# Patient Record
Sex: Female | Born: 1990 | Hispanic: Yes | Marital: Single | Smoking: Never smoker
Health system: Southern US, Community
[De-identification: ages and names within clinical notes are randomized; demographics above are authoritative.]

## PROBLEM LIST (undated history)

## (undated) ENCOUNTER — Inpatient Hospital Stay (HOSPITAL_COMMUNITY): Payer: Medicaid Other

---
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
## 2016-05-03 ENCOUNTER — Emergency Department (HOSPITAL_COMMUNITY)
Admission: EM | Admit: 2016-05-03 | Discharge: 2016-05-03 | Disposition: A | Discharge status: Home / Self Care | Payer: Self-pay | Attending: Emergency Medicine | Admitting: Emergency Medicine

## 2016-05-03 ENCOUNTER — Encounter (HOSPITAL_COMMUNITY): Payer: Self-pay

## 2016-05-03 DIAGNOSIS — J45909 Unspecified asthma, uncomplicated: Secondary | ICD-10-CM | POA: Insufficient documentation

## 2016-05-03 DIAGNOSIS — R102 Pelvic and perineal pain: Secondary | ICD-10-CM | POA: Insufficient documentation

## 2016-05-03 LAB — CBC
HEMATOCRIT: 39.5 % (ref 36.0–46.0)
HEMOGLOBIN: 13.8 g/dL (ref 12.0–15.0)
MCH: 30.9 pg (ref 26.0–34.0)
MCHC: 34.9 g/dL (ref 30.0–36.0)
MCV: 88.6 fL (ref 78.0–100.0)
Platelets: 207 10*3/uL (ref 150–400)
RBC: 4.46 MIL/uL (ref 3.87–5.11)
RDW: 12.1 % (ref 11.5–15.5)
WBC: 6.9 10*3/uL (ref 4.0–10.5)

## 2016-05-03 LAB — WET PREP, GENITAL
Clue Cells Wet Prep HPF POC: NONE SEEN
Sperm: NONE SEEN
Trich, Wet Prep: NONE SEEN
YEAST WET PREP: NONE SEEN

## 2016-05-03 LAB — LIPASE, BLOOD: LIPASE: 24 U/L (ref 11–51)

## 2016-05-03 LAB — URINALYSIS, ROUTINE W REFLEX MICROSCOPIC
Bilirubin Urine: NEGATIVE
GLUCOSE, UA: NEGATIVE mg/dL
Hgb urine dipstick: NEGATIVE
KETONES UR: NEGATIVE mg/dL
Nitrite: NEGATIVE
PH: 5.5 (ref 5.0–8.0)
Protein, ur: NEGATIVE mg/dL
SPECIFIC GRAVITY, URINE: 1.017 (ref 1.005–1.030)

## 2016-05-03 LAB — COMPREHENSIVE METABOLIC PANEL
ALBUMIN: 4.2 g/dL (ref 3.5–5.0)
ALT: 12 U/L — ABNORMAL LOW (ref 14–54)
ANION GAP: 7 (ref 5–15)
AST: 19 U/L (ref 15–41)
Alkaline Phosphatase: 88 U/L (ref 38–126)
BUN: 8 mg/dL (ref 6–20)
CHLORIDE: 106 mmol/L (ref 101–111)
CO2: 24 mmol/L (ref 22–32)
Calcium: 9.8 mg/dL (ref 8.9–10.3)
Creatinine, Ser: 0.65 mg/dL (ref 0.44–1.00)
GFR calc Af Amer: >60 mL/min (ref >60)
GLUCOSE: 94 mg/dL (ref 65–99)
POTASSIUM: 4 mmol/L (ref 3.5–5.1)
Sodium: 137 mmol/L (ref 135–145)
Total Bilirubin: 0.5 mg/dL (ref 0.3–1.2)
Total Protein: 6.7 g/dL (ref 6.5–8.1)

## 2016-05-03 LAB — URINE MICROSCOPIC-ADD ON

## 2016-05-03 LAB — PREGNANCY, URINE: Preg Test, Ur: NEGATIVE

## 2016-05-03 LAB — POC URINE PREG, ED: PREG TEST UR: NEGATIVE

## 2016-05-03 MED ORDER — NAPROXEN 250 MG PO TABS
500.0000 mg | ORAL_TABLET | Freq: Once | ORAL | Status: AC
  Administered 2016-05-03: 500 mg via ORAL
  Filled 2016-05-03: qty 2

## 2016-05-03 MED ORDER — NAPROXEN 500 MG PO TABS: 500.0000 mg | ORAL_TABLET | Freq: Two times a day (BID) | ORAL | 0 refills | Status: DC | PRN

## 2016-05-03 NOTE — ED Notes (Signed)
Pt ambulating independently w/ steady gait on d/c in no acute distress, A&Ox4. Rx given x1 D/c instructions reviewed w/ pt and family - pt and family deny any further questions or concerns at present.   

## 2016-05-03 NOTE — ED Provider Notes (Signed)
[facility]-EMERGENCY DEPT Provider Note   CSN: [id number] Arrival date & time: 05/03/16  1738     History   Chief Complaint Chief Complaint  Patient presents with  . Abdominal Pain    HPI Amy Villegas is a 25 y.o. female.  Patient presents with 3-4 days of lower abdominal pain. It began after she had her menstrual period several days ago and then did not improve. She has never had a similar pain. Describes it as sharp and mostly suprapubic, not radiating. She has felt nauseated but without vomiting. Denies fevers, diarrhea, or constipation. Denies vaginal discharge. History of a C section two years ago but no other abdominal surgeries. States the pain sometimes worsens when she leans forward but notes no other exacerbating or alleviating symptoms.   The history is provided by the patient. No language interpreter was used.  Abdominal Pain   This is a new problem. The current episode started more than 2 days ago. The problem occurs constantly. The problem has not changed since onset.Associated with: Menstrual cycle. The pain is located in the suprapubic region. The quality of the pain is sharp. The pain is at a severity of 5/10. The pain is moderate. Associated symptoms include nausea. Pertinent negatives include anorexia, fever, diarrhea, vomiting, constipation, dysuria and hematuria. The symptoms are aggravated by certain positions. Nothing relieves the symptoms. Past workup does not include GI consult, CT scan or surgery.    Past Medical History:  Diagnosis Date  . Asthma     There are no active problems to display for this patient.   History reviewed. No pertinent surgical history.  OB History    No data available       Home Medications    Prior to Admission medications   Medication Sig Start Date End Date Taking? Authorizing Provider  naproxen (NAPROSYN) 500 MG tablet Take 1 tablet (500 mg total) by mouth 2 (two) times daily as needed for mild pain or moderate  pain. 05/03/16   Preston FleetingAnna Ayanna Gheen, MD    Family History History reviewed. No pertinent family history.  Social History Social History  Substance Use Topics  . Smoking status: Never Smoker  . Smokeless tobacco: Never Used  . Alcohol use No     Allergies   Patient has no known allergies.   Review of Systems Review of Systems  Constitutional: Negative for fever.  HENT: Negative.   Respiratory: Negative.   Cardiovascular: Negative.   Gastrointestinal: Positive for abdominal pain and nausea. Negative for anorexia, constipation, diarrhea and vomiting.  Genitourinary: Negative for dysuria, hematuria, vaginal bleeding and vaginal discharge.  Musculoskeletal: Negative.   Skin: Negative.   Allergic/Immunologic: Negative for immunocompromised state.  Neurological: Negative.   Hematological: Does not bruise/bleed easily.  Psychiatric/Behavioral: Negative.      Physical Exam Updated Vital Signs BP 112/65   Pulse 71   Temp 98.4 F (36.9 C) (Oral)   Resp 16   LMP 04/30/2016 (Within Days)   SpO2 99%   Physical Exam  Constitutional: She is oriented to person, place, and time. She appears well-developed and well-nourished. No distress.  HENT:  Head: Normocephalic and atraumatic.  Eyes: Conjunctivae and EOM are normal.  Neck: Normal range of motion. Neck supple.  Cardiovascular: Normal rate and regular rhythm.   Pulmonary/Chest: Effort normal. No respiratory distress.  Abdominal: Soft. Normal appearance. There is tenderness in the suprapubic area. There is no rigidity, no rebound, no guarding, no CVA tenderness, no tenderness at McBurney's point and negative Murphy's  sign.  Neurological: She is alert and oriented to person, place, and time.  Skin: She is not diaphoretic.  Psychiatric: She has a normal mood and affect. Her behavior is normal. Judgment and thought content normal.     ED Treatments / Results  Labs (all labs ordered are listed, but only abnormal results are  displayed) Labs Reviewed  WET PREP, GENITAL - Abnormal; Notable for the following:       Result Value   WBC, Wet Prep HPF POC MANY (*)    All other components within normal limits  COMPREHENSIVE METABOLIC PANEL - Abnormal; Notable for the following:    ALT 12 (*)    All other components within normal limits  URINALYSIS, ROUTINE W REFLEX MICROSCOPIC (NOT AT [facility]) - Abnormal; Notable for the following:    Leukocytes, UA TRACE (*)    All other components within normal limits  URINE MICROSCOPIC-ADD ON - Abnormal; Notable for the following:    Squamous Epithelial / LPF 6-30 (*)    Bacteria, UA RARE (*)    All other components within normal limits  LIPASE, BLOOD  CBC  PREGNANCY, URINE  POC URINE PREG, ED  GC/CHLAMYDIA PROBE AMP ([place]) NOT AT [facility]    EKG  EKG Interpretation None       Radiology No results found.  Procedures Procedures (including critical care time)  Medications Ordered in ED Medications  naproxen (NAPROSYN) tablet 500 mg (500 mg Oral Given 05/03/16 2243)     Initial Impression / Assessment and Plan / ED Course  I have reviewed the triage vital signs and the nursing notes.  Pertinent labs & imaging results that were available during my care of the patient were reviewed by me and considered in my medical decision making (see chart for details).  Clinical Course     Patient presents with several days of sharp lower abdominal pain associated with her menstrual cycle but reports it has not improved even though she is no longer menstruating. She is overall well-appearing and afebrile and appears comfortable. Vital signs are unremarkable. She is not pregnant. No signs of UTI/pyelonephritis and labs were unremarkable. Pelvic unremarkable without signs of PID or cervicitis. She has no RLQ tenderness and I do not suspect appendicitis. Onset of symptoms and mild nature are not suggestive of ovarian torsion. Feel that these symptoms are appropriate for  outpatient management. She was advised to follow up with her gynecologist and given return precautions for worsening symptoms. She expressed understanding and is in good condition for discharge home.  Final Clinical Impressions(s) / ED Diagnoses   Final diagnoses:  Pelvic pain in female    New Prescriptions New Prescriptions   NAPROXEN (NAPROSYN) 500 MG TABLET    Take 1 tablet (500 mg total) by mouth 2 (two) times daily as needed for mild pain or moderate pain.     Preston FleetingAnna Alieah Brinton, MD 05/03/16 [id number]    Gerhard Munchobert Lockwood, MD 05/07/16 [id number]

## 2016-05-03 NOTE — Discharge Instructions (Signed)
Please go see your gynecologist within the next week if your symptoms continue. Come back sooner if you are having high fever, vomiting, severe pain, or any other concerns.

## 2016-05-03 NOTE — ED Triage Notes (Signed)
Pt reports lower abd pain X3 days. Pt reports LMP 04/30/2016. Pt reports some nausea as well. Pt denies urinary sx.

## 2016-05-05 LAB — GC/CHLAMYDIA PROBE AMP (~~LOC~~) NOT AT ARMC
Chlamydia: NEGATIVE
Neisseria Gonorrhea: NEGATIVE

## 2017-11-14 ENCOUNTER — Encounter (HOSPITAL_COMMUNITY): Payer: Self-pay | Admitting: Emergency Medicine

## 2017-11-14 ENCOUNTER — Emergency Department (HOSPITAL_COMMUNITY)
Admission: EM | Admit: 2017-11-14 | Discharge: 2017-11-15 | Disposition: A | Discharge status: Home / Self Care | Payer: Medicaid Other | Attending: Emergency Medicine | Admitting: Emergency Medicine

## 2017-11-14 DIAGNOSIS — O26899 Other specified pregnancy related conditions, unspecified trimester: Secondary | ICD-10-CM | POA: Diagnosis not present

## 2017-11-14 DIAGNOSIS — R1084 Generalized abdominal pain: Secondary | ICD-10-CM | POA: Diagnosis not present

## 2017-11-14 DIAGNOSIS — Z3A00 Weeks of gestation of pregnancy not specified: Secondary | ICD-10-CM | POA: Insufficient documentation

## 2017-11-14 DIAGNOSIS — O26859 Spotting complicating pregnancy, unspecified trimester: Secondary | ICD-10-CM | POA: Insufficient documentation

## 2017-11-14 DIAGNOSIS — Z5321 Procedure and treatment not carried out due to patient leaving prior to being seen by health care provider: Secondary | ICD-10-CM | POA: Diagnosis not present
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
## 2017-11-14 NOTE — ED Triage Notes (Signed)
Attempted doppler FHR - unable to find at this time.

## 2017-11-15 LAB — HCG, QUANTITATIVE, PREGNANCY: HCG, BETA CHAIN, QUANT, S: 98614 m[IU]/mL — AB (ref <5)

## 2017-11-15 NOTE — ED Notes (Signed)
1300  11/15/17  Follow up call made  No answer  s Tavien Chestnut rn

## 2020-03-20 DIAGNOSIS — U071 COVID-19: Secondary | ICD-10-CM

## 2020-03-20 HISTORY — DX: COVID-19: U07.1

## 2020-04-16 ENCOUNTER — Ambulatory Visit
Admission: EM | Admit: 2020-04-16 | Discharge: 2020-04-16 | Disposition: A | Discharge status: Home / Self Care | Payer: Medicaid Other | Attending: Emergency Medicine | Admitting: Emergency Medicine

## 2020-04-16 ENCOUNTER — Other Ambulatory Visit: Payer: Self-pay

## 2020-04-16 ENCOUNTER — Emergency Department (HOSPITAL_COMMUNITY)
Admission: EM | Admit: 2020-04-16 | Discharge: 2020-04-16 | Disposition: A | Discharge status: Home / Self Care | Payer: Medicaid Other | Attending: Emergency Medicine | Admitting: Emergency Medicine

## 2020-04-16 ENCOUNTER — Emergency Department (HOSPITAL_COMMUNITY): Payer: Medicaid Other

## 2020-04-16 ENCOUNTER — Encounter (HOSPITAL_COMMUNITY): Payer: Self-pay | Admitting: Emergency Medicine

## 2020-04-16 DIAGNOSIS — J45909 Unspecified asthma, uncomplicated: Secondary | ICD-10-CM | POA: Insufficient documentation

## 2020-04-16 DIAGNOSIS — R1031 Right lower quadrant pain: Secondary | ICD-10-CM | POA: Insufficient documentation

## 2020-04-16 DIAGNOSIS — R110 Nausea: Secondary | ICD-10-CM | POA: Diagnosis not present

## 2020-04-16 LAB — URINALYSIS, ROUTINE W REFLEX MICROSCOPIC
Bilirubin Urine: NEGATIVE
Glucose, UA: NEGATIVE mg/dL
Hgb urine dipstick: NEGATIVE
Ketones, ur: 20 mg/dL — AB
Leukocytes,Ua: NEGATIVE
Nitrite: NEGATIVE
Protein, ur: NEGATIVE mg/dL
Specific Gravity, Urine: 1.018 (ref 1.005–1.030)
pH: 5 (ref 5.0–8.0)

## 2020-04-16 LAB — CBC
HCT: 44.4 % (ref 36.0–46.0)
Hemoglobin: 14.6 g/dL (ref 12.0–15.0)
MCH: 29.7 pg (ref 26.0–34.0)
MCHC: 32.9 g/dL (ref 30.0–36.0)
MCV: 90.2 fL (ref 80.0–100.0)
Platelets: 232 10*3/uL (ref 150–400)
RBC: 4.92 MIL/uL (ref 3.87–5.11)
RDW: 12.5 % (ref 11.5–15.5)
WBC: 8.8 10*3/uL (ref 4.0–10.5)
nRBC: 0 % (ref 0.0–0.2)

## 2020-04-16 LAB — COMPREHENSIVE METABOLIC PANEL
ALT: 15 U/L (ref 0–44)
AST: 18 U/L (ref 15–41)
Albumin: 4.4 g/dL (ref 3.5–5.0)
Alkaline Phosphatase: 94 U/L (ref 38–126)
Anion gap: 12 (ref 5–15)
BUN: 8 mg/dL (ref 6–20)
CO2: 22 mmol/L (ref 22–32)
Calcium: 9.5 mg/dL (ref 8.9–10.3)
Chloride: 103 mmol/L (ref 98–111)
Creatinine, Ser: 0.62 mg/dL (ref 0.44–1.00)
GFR, Estimated: >60 mL/min (ref >60)
Glucose, Bld: 87 mg/dL (ref 70–99)
Potassium: 3.8 mmol/L (ref 3.5–5.1)
Sodium: 137 mmol/L (ref 135–145)
Total Bilirubin: 0.6 mg/dL (ref 0.3–1.2)
Total Protein: 7.7 g/dL (ref 6.5–8.1)

## 2020-04-16 LAB — POCT URINALYSIS DIP (MANUAL ENTRY)
Bilirubin, UA: NEGATIVE
Blood, UA: NEGATIVE
Glucose, UA: NEGATIVE mg/dL
Ketones, POC UA: NEGATIVE mg/dL
Leukocytes, UA: NEGATIVE
Nitrite, UA: NEGATIVE
Protein Ur, POC: NEGATIVE mg/dL
Spec Grav, UA: 1.025 (ref 1.010–1.025)
Urobilinogen, UA: 0.2 E.U./dL
pH, UA: 5.5 (ref 5.0–8.0)

## 2020-04-16 LAB — LIPASE, BLOOD: Lipase: 22 U/L (ref 11–51)

## 2020-04-16 LAB — POCT URINE PREGNANCY: Preg Test, Ur: NEGATIVE

## 2020-04-16 LAB — WET PREP, GENITAL
Clue Cells Wet Prep HPF POC: NONE SEEN
Sperm: NONE SEEN
Trich, Wet Prep: NONE SEEN
Yeast Wet Prep HPF POC: NONE SEEN

## 2020-04-16 MED ORDER — DICYCLOMINE HCL 20 MG PO TABS: 20.0000 mg | ORAL_TABLET | Freq: Three times a day (TID) | ORAL | 0 refills | Status: DC | PRN

## 2020-04-16 MED ORDER — ONDANSETRON 4 MG PO TBDP: 4.0000 mg | ORAL_TABLET | Freq: Three times a day (TID) | ORAL | 0 refills | Status: AC | PRN

## 2020-04-16 MED ORDER — IOHEXOL 300 MG/ML  SOLN
100.0000 mL | Freq: Once | INTRAMUSCULAR | Status: AC | PRN
  Administered 2020-04-16: 100 mL via INTRAVENOUS

## 2020-04-16 MED ORDER — MORPHINE SULFATE (PF) 4 MG/ML IV SOLN
4.0000 mg | Freq: Once | INTRAVENOUS | Status: AC
  Administered 2020-04-16: 4 mg via INTRAVENOUS
  Filled 2020-04-16: qty 1

## 2020-04-16 MED ORDER — LACTATED RINGERS IV BOLUS
1000.0000 mL | Freq: Once | INTRAVENOUS | Status: AC
  Administered 2020-04-16: 1000 mL via INTRAVENOUS

## 2020-04-16 NOTE — ED Notes (Signed)
Pt given ginger ale, graham crackers and peanut butter. 

## 2020-04-16 NOTE — ED Provider Notes (Signed)
[facility] EMERGENCY DEPARTMENT Provider Note   CSN: [id number] Arrival date & time: 04/16/20  1527     History Chief Complaint  Patient presents with   Abdominal Pain    Amy Villegas is a 29 y.o. female with no pertinent past medical history who presents today for evaluation of 2 days of nausea and right lower quadrant abdominal pain.  She was originally seen at urgent care today where system review shows she had a negative pregnancy test, based on the right lower quadrant location of her pain she was referred here for further evaluation.  She reports she still has her appendix, only prior abdominal surgery is a C-section.  She reports the pain is sharp, at the worst it is 8 out of 10.  She has a history of kidney stones however says this is not exactly the same.  Her pain gets worse with eating.  She has tried Tylenol without significant during her symptoms.  She denies any changes in her bowel movements, no constipation or diarrhea.  She is nauseous without vomiting.  She denies any dysuria, increased frequency or urgency.  No abnormal vaginal discharge or bleeding.  She is not concerned about STDs stating she has 1 partner and is monogamous.  HPI     Past Medical History:  Diagnosis Date   Asthma     There are no problems to display for this patient.   History reviewed. No pertinent surgical history.   OB History    Gravida  1   Para      Term      Preterm      AB      Living        SAB      TAB      Ectopic      Multiple      Live Births              History reviewed. No pertinent family history.  Social History   Tobacco Use   Smoking status: Never Smoker   Smokeless tobacco: Never Used  Substance Use Topics   Alcohol use: No   Drug use: No    Home Medications Prior to Admission medications   Medication Sig Start Date End Date Taking? Authorizing Provider  dicyclomine (BENTYL) 20 MG tablet Take 1 tablet (20 mg total) by  mouth 3 (three) times daily as needed for spasms (abdominal pain). 04/16/20   Cristina Gong, PA-C  naproxen (NAPROSYN) 500 MG tablet Take 1 tablet (500 mg total) by mouth 2 (two) times daily as needed for mild pain or moderate pain. 05/03/16   Preston Fleeting, MD  ondansetron (ZOFRAN ODT) 4 MG disintegrating tablet Take 1 tablet (4 mg total) by mouth every 8 (eight) hours as needed for nausea or vomiting. 04/16/20   Cristina Gong, PA-C    Allergies    Patient has no known allergies.  Review of Systems   Review of Systems  Constitutional: Negative for chills and fever.  Respiratory: Negative for shortness of breath.   Cardiovascular: Negative for chest pain.  Gastrointestinal: Positive for abdominal pain and nausea. Negative for abdominal distention, constipation and vomiting.  Genitourinary: Negative for dysuria, flank pain, frequency and urgency.  Musculoskeletal: Negative for back pain and neck pain.  Skin: Negative for color change and rash.  Neurological: Negative for weakness and headaches.  All other systems reviewed and are negative.   Physical Exam Updated Vital Signs BP 114/65  Pulse 76    Temp 98.3 F (36.8 C) (Oral)    Resp 18    Ht 5\' 2"  (1.575 m)    Wt 72.6 kg    LMP  (LMP Unknown)    SpO2 100%    BMI 29.26 kg/m   Physical Exam Vitals and nursing note reviewed. Exam conducted with a chaperone present.  Constitutional:      General: She is not in acute distress.    Appearance: She is well-developed.  HENT:     Head: Normocephalic and atraumatic.  Eyes:     Conjunctiva/sclera: Conjunctivae normal.  Cardiovascular:     Rate and Rhythm: Normal rate and regular rhythm.     Heart sounds: No murmur heard.   Pulmonary:     Effort: Pulmonary effort is normal. No respiratory distress.     Breath sounds: Normal breath sounds.  Abdominal:     General: Abdomen is flat. Bowel sounds are normal.     Palpations: Abdomen is soft.     Tenderness: There is  abdominal tenderness in the right lower quadrant. There is no right CVA tenderness, left CVA tenderness, guarding or rebound.  Genitourinary:    Adnexa: Right adnexa normal and left adnexa normal.     Comments: Normal external female genitalia.  There is a moderate amount of white discharge in the vaginal canal.  Mild tenderness to palpation in the right adnexa, no fullness appreciated.  No cervical motion tenderness or purulent discharge from the cervical os.   Musculoskeletal:     Cervical back: Neck supple.  Skin:    General: Skin is warm and dry.  Neurological:     Mental Status: She is alert.     ED Results / Procedures / Treatments   Labs (all labs ordered are listed, but only abnormal results are displayed) Labs Reviewed  WET PREP, GENITAL - Abnormal; Notable for the following components:      Result Value   WBC, Wet Prep HPF POC MODERATE (*)    All other components within normal limits  URINALYSIS, ROUTINE W REFLEX MICROSCOPIC - Abnormal; Notable for the following components:   APPearance HAZY (*)    Ketones, ur 20 (*)    All other components within normal limits  LIPASE, BLOOD  COMPREHENSIVE METABOLIC PANEL  CBC  GC/CHLAMYDIA PROBE AMP ([place]) NOT AT [facility]    EKG None  Radiology CT Abdomen Pelvis W Contrast  Result Date: 04/16/2020 CLINICAL DATA:  Right lower quadrant pain, appendicitis suspected, pain for 2 days EXAM: CT ABDOMEN AND PELVIS WITH CONTRAST TECHNIQUE: Multidetector CT imaging of the abdomen and pelvis was performed using the standard protocol following bolus administration of intravenous contrast. CONTRAST:  80 cc OMNIPAQUE IOHEXOL 300 MG/ML  SOLN COMPARISON:  None. FINDINGS: Lower chest: Lung bases are clear. Normal heart size. No pericardial effusion. Hepatobiliary: No worrisome focal liver lesions. Smooth liver surface contour. Normal hepatic attenuation. Normal gallbladder and biliary tree. Pancreas: No pancreatic ductal dilatation or surrounding  inflammatory changes. Spleen: Normal in size. No concerning splenic lesions. Adrenals/Urinary Tract: Normal adrenal glands. Kidneys are normally located with symmetric enhancement. No suspicious renal lesion, urolithiasis or hydronephrosis. Urinary bladder is unremarkable. Stomach/Bowel: Distal esophagus, stomach and duodenal sweep are unremarkable. No small bowel wall thickening or dilatation. No evidence of obstruction. Normal air-filled appendix seen coursing from the tip of the cecum to the pelvis/right adnexa. No focal periappendiceal stranding or dilatation. No extraluminal gas or organized collection. No colonic dilatation or wall thickening.  Vascular/Lymphatic: No significant vascular findings are present. No enlarged abdominal or pelvic lymph nodes. Reproductive: Anteverted uterus.  No concerning adnexal lesions. Other: No abdominopelvic free air or free fluid. No bowel containing hernia. Small fat containing umbilical hernia. Musculoskeletal: No acute osseous abnormality or suspicious osseous lesion. Musculature is normal and symmetric. IMPRESSION: 1. No evidence of acute appendicitis. Normal appendix in the right lower quadrant coursing towards the pelvic sidewall/right adnexa. 2. No other acute abnormality in the abdomen or pelvis to provide a cause for patient's symptoms. Electronically Signed   By: Kreg Shropshire M.D.   On: 04/16/2020 20:58    Procedures Procedures (including critical care time)  Medications Ordered in ED Medications  lactated ringers bolus 1,000 mL (0 mLs Intravenous Stopped 04/16/20 2042)  morphine 4 MG/ML injection 4 mg (4 mg Intravenous Given 04/16/20 2001)  iohexol (OMNIPAQUE) 300 MG/ML solution 100 mL (100 mLs Intravenous Contrast Given 04/16/20 2042)    ED Course  I have reviewed the triage vital signs and the nursing notes.  Pertinent labs & imaging results that were available during my care of the patient were reviewed by me and considered in my medical decision  making (see chart for details).    MDM Rules/Calculators/A&P                         Patient is a 29 year old woman who presents today for evaluation of 2 days of right lower quadrant abdominal pain.  She still has her appendix.  On exam she is tender to palpation in the right lower quadrant.  She denies concern for STI.  Pelvic exam is performed showing mild right adnexal pain.  CT abdomen pelvis with contrast is obtained showing normal appendix with no other acute abnormality in the abdomen or pelvis to cause her pain.  Given that she has had symptoms for 2 days if she had a ovarian torsion I would expect abnormality on the contrasted CT scan at this point, additionally if this was an ovarian torsion she would be outside window for salvage.  She does not have cervical motion tenderness and I doubt PID based on exam and reassuring labs.    CBC CMP lipase are all unremarkable.  GC testing is sent.  Wet prep does show moderate white blood cells without other abnormalities.  UA shows 20 ketones however is otherwise normal.  Patient is afebrile, not tachycardic or tachypneic.  Her pain was treated in the emergency room with morphine and she was given IV fluids.  After this she was able to p.o. challenge without difficulty.  Orders placed for outpatient pelvic ultrasound.  She does have an OB/GYN so additionally recommended follow-up there.  She is given prescriptions for Bentyl and Zofran as needed.  Recommended conservative care, outpatient follow-up.  Return precautions were discussed with patient who states their understanding.  At the time of discharge patient denied any unaddressed complaints or concerns.  Patient is agreeable for discharge home.  Note: Portions of this report may have been transcribed using voice recognition software. Every effort was made to ensure accuracy; however, inadvertent computerized transcription errors may be present   Final Clinical Impression(s) / ED  Diagnoses Final diagnoses:  Right lower quadrant abdominal pain    Rx / DC Orders ED Discharge Orders         Ordered    US PELVIC COMPLETE WITH TRANSVAGINAL        04/16/20 2203    dicyclomine (BENTYL) 20  MG tablet  3 times daily PRN        04/16/20 2247    ondansetron (ZOFRAN ODT) 4 MG disintegrating tablet  Every 8 hours PRN        04/16/20 2247           Cristina GongHammond, Nikea Settle W, PA-C 04/17/20 0002    Gilda CreasePollina, Christopher J, MD 04/18/20 [phone]

## 2020-04-16 NOTE — ED Triage Notes (Signed)
Pt c/o of RLQ pain with nausea x 2 days

## 2020-04-16 NOTE — Discharge Instructions (Addendum)
Today you received medications that may make you sleepy or impair your ability to make decisions.  For the next 24 hours please do not drive, operate heavy machinery, care for a small child with out another adult present, or perform any activities that may cause harm to you or someone else if you were to fall asleep or be impaired.   You are being prescribed a medication which may make you sleepy. Please follow up of listed precautions for at least 24 hours after taking one dose.   Bentyl is a medicine to help with abdominal cramps and will hopefully help with your pain.  Zofran is a medicine to help with nausea and vomiting.  You may also try a heating pad.  If your symptoms worsen or you need to seek additional medical care I would recommend considering going to [facility] in [place] as OB/GYN is in the same building and they're able to do ultrasounds 24/7 if needed.

## 2020-04-16 NOTE — ED Provider Notes (Signed)
[facility] CARE CENTER   [id number] 04/16/20 Arrival Time: 1324  Chief Complaint  Patient presents with   Abdominal Pain     SUBJECTIVE:  Amy Villegas is a 29 y.o. female who presented to the urgent care with a complaint of right lower quadrant pain for the past 2 days.  Denies a precipitating event, or specific injury.  Patient localizes pain to right lower quadrant.  Described as constant achy and sharp in character.  Has not tried any OTC medication.  Denies aggravating factors.  Denies similar symptoms in the past.  Denies fever, chills, appetite change, weight change, chest pain, nausea, vomiting, changes in bowel or bladder habits.  No LMP recorded (lmp unknown). Patient has had an implant.  ROS: As per HPI.  All other pertinent ROS negative.     Past Medical History:  Diagnosis Date   Asthma    History reviewed. No pertinent surgical history. No Known Allergies No current facility-administered medications on file prior to encounter.   Current Outpatient Medications on File Prior to Encounter  Medication Sig Dispense Refill   naproxen (NAPROSYN) 500 MG tablet Take 1 tablet (500 mg total) by mouth 2 (two) times daily as needed for mild pain or moderate pain. 30 tablet 0   Social History   Socioeconomic History   Marital status: Single    Spouse name: Not on file   Number of children: Not on file   Years of education: Not on file   Highest education level: Not on file  Occupational History   Not on file  Tobacco Use   Smoking status: Never Smoker   Smokeless tobacco: Never Used  Substance and Sexual Activity   Alcohol use: No   Drug use: No   Sexual activity: Not on file  Other Topics Concern   Not on file  Social History Narrative   Not on file   Social Determinants of Health   Financial Resource Strain:    Difficulty of Paying Living Expenses: Not on file  Food Insecurity:    Worried About Running Out of Food in the Last  Year: Not on file   Ran Out of Food in the Last Year: Not on file  Transportation Needs:    Lack of Transportation (Medical): Not on file   Lack of Transportation (Non-Medical): Not on file  Physical Activity:    Days of Exercise per Week: Not on file   Minutes of Exercise per Session: Not on file  Stress:    Feeling of Stress : Not on file  Social Connections:    Frequency of Communication with Friends and Family: Not on file   Frequency of Social Gatherings with Friends and Family: Not on file   Attends Religious Services: Not on file   Active Member of Clubs or Organizations: Not on file   Attends Banker Meetings: Not on file   Marital Status: Not on file  Intimate Partner Violence:    Fear of Current or Ex-Partner: Not on file   Emotionally Abused: Not on file   Physically Abused: Not on file   Sexually Abused: Not on file   History reviewed. No pertinent family history.   OBJECTIVE:  Vitals:   04/16/20 1417  BP: 110/77  Pulse: 81  Resp: 20  Temp: 99 F (37.2 C)  SpO2: 98%    Physical Exam Vitals and nursing note reviewed.  Constitutional:      General: She is not in acute distress.  Appearance: Normal appearance. She is normal weight. She is not ill-appearing, toxic-appearing or diaphoretic.  HENT:     Head: Normocephalic.  Cardiovascular:     Rate and Rhythm: Normal rate and regular rhythm.     Pulses: Normal pulses.     Heart sounds: Normal heart sounds. No murmur heard.  No friction rub. No gallop.   Pulmonary:     Effort: Pulmonary effort is normal. No respiratory distress.     Breath sounds: Normal breath sounds. No stridor. No wheezing, rhonchi or rales.  Chest:     Chest wall: No tenderness.  Abdominal:     General: Bowel sounds are normal.     Tenderness: There is abdominal tenderness in the right lower quadrant. There is right CVA tenderness. There is no left CVA tenderness, guarding or rebound.  Neurological:      Mental Status: She is alert and oriented to person, place, and time.    LABS: Results for orders placed or performed during the hospital encounter of 04/16/20 (from the past 24 hour(s))  POCT urinalysis dipstick     Status: None   Collection Time: 04/16/20  2:28 PM  Result Value Ref Range   Color, UA yellow yellow   Clarity, UA clear clear   Glucose, UA negative negative mg/dL   Bilirubin, UA negative negative   Ketones, POC UA negative negative mg/dL   Spec Grav, UA 9.163 8.466 - 1.025   Blood, UA negative negative   pH, UA 5.5 5.0 - 8.0   Protein Ur, POC negative negative mg/dL   Urobilinogen, UA 0.2 0.2 or 1.0 E.U./dL   Nitrite, UA Negative Negative   Leukocytes, UA Negative Negative  POCT urine pregnancy     Status: None   Collection Time: 04/16/20  2:28 PM  Result Value Ref Range   Preg Test, Ur Negative Negative    DIAGNOSTIC STUDIES: No results found.   ASSESSMENT & PLAN:  1. Right lower quadrant abdominal pain     No orders of the defined types were placed in this encounter.  Patient is stable at discharge.  There is a low-grade temp with right lower quadrant pain and tenderness on palpation.  Urine pregnancy and urine analysis were negative.  She was advised to go to ER for further evaluation to rule out other abdominal disease process such as appendicitis  Discharge instructions  Please go to ER for further evaluation  Reviewed expectations re: course of current medical issues. Questions answered. Outlined signs and symptoms indicating need for more acute intervention. Patient verbalized understanding. After Visit Summary given.   Durward Parcel, FNP 04/16/20 1511

## 2020-04-16 NOTE — Discharge Instructions (Addendum)
Go to ER for further evaluation .  

## 2020-04-16 NOTE — ED Triage Notes (Signed)
Pt presents with right lower quadrant pain that began a couple days ago

## 2020-04-18 LAB — URINE CULTURE: Culture: >=100000 — AB

## 2020-04-20 LAB — GC/CHLAMYDIA PROBE AMP (~~LOC~~) NOT AT ARMC
Chlamydia: NEGATIVE
Comment: NEGATIVE
Comment: NORMAL
Neisseria Gonorrhea: NEGATIVE

## 2021-12-30 ENCOUNTER — Inpatient Hospital Stay (HOSPITAL_COMMUNITY): Payer: Medicaid Other

## 2021-12-30 ENCOUNTER — Other Ambulatory Visit: Payer: Self-pay

## 2021-12-30 ENCOUNTER — Encounter (HOSPITAL_COMMUNITY): Payer: Self-pay | Admitting: Obstetrics and Gynecology

## 2021-12-30 ENCOUNTER — Inpatient Hospital Stay (HOSPITAL_COMMUNITY)
Admission: AD | Admit: 2021-12-30 | Discharge: 2021-12-30 | Disposition: A | Discharge status: Home / Self Care | Payer: Medicaid Other | Attending: Obstetrics and Gynecology | Admitting: Obstetrics and Gynecology

## 2021-12-30 DIAGNOSIS — O209 Hemorrhage in early pregnancy, unspecified: Secondary | ICD-10-CM | POA: Diagnosis present

## 2021-12-30 DIAGNOSIS — Z6790 Unspecified blood type, Rh positive: Secondary | ICD-10-CM

## 2021-12-30 DIAGNOSIS — Z3491 Encounter for supervision of normal pregnancy, unspecified, first trimester: Secondary | ICD-10-CM

## 2021-12-30 DIAGNOSIS — Z3A08 8 weeks gestation of pregnancy: Secondary | ICD-10-CM | POA: Diagnosis not present

## 2021-12-30 LAB — URINALYSIS, ROUTINE W REFLEX MICROSCOPIC
Bilirubin Urine: NEGATIVE
Glucose, UA: NEGATIVE mg/dL
Ketones, ur: NEGATIVE mg/dL
Leukocytes,Ua: NEGATIVE
Nitrite: NEGATIVE
Protein, ur: NEGATIVE mg/dL
RBC / HPF: >50 RBC/hpf — ABNORMAL HIGH (ref 0–5)
Specific Gravity, Urine: 1.02 (ref 1.005–1.030)
pH: 5 (ref 5.0–8.0)

## 2021-12-30 LAB — CBC
HCT: 36.2 % (ref 36.0–46.0)
Hemoglobin: 12.4 g/dL (ref 12.0–15.0)
MCH: 29.5 pg (ref 26.0–34.0)
MCHC: 34.3 g/dL (ref 30.0–36.0)
MCV: 86 fL (ref 80.0–100.0)
Platelets: 209 10*3/uL (ref 150–400)
RBC: 4.21 MIL/uL (ref 3.87–5.11)
RDW: 12.8 % (ref 11.5–15.5)
WBC: 9.2 10*3/uL (ref 4.0–10.5)
nRBC: 0 % (ref 0.0–0.2)

## 2021-12-30 LAB — WET PREP, GENITAL
Clue Cells Wet Prep HPF POC: NONE SEEN
Sperm: NONE SEEN
Trich, Wet Prep: NONE SEEN
WBC, Wet Prep HPF POC: <10 (ref <10)
Yeast Wet Prep HPF POC: NONE SEEN

## 2021-12-30 LAB — GC/CHLAMYDIA PROBE AMP (~~LOC~~) NOT AT ARMC
Chlamydia: NEGATIVE
Comment: NEGATIVE
Comment: NORMAL
Neisseria Gonorrhea: NEGATIVE

## 2021-12-30 LAB — HCG, QUANTITATIVE, PREGNANCY: hCG, Beta Chain, Quant, S: 243491 m[IU]/mL — ABNORMAL HIGH (ref <5)

## 2021-12-30 LAB — POCT PREGNANCY, URINE: Preg Test, Ur: POSITIVE — AB

## 2021-12-30 LAB — ABO/RH: ABO/RH(D): O POS

## 2021-12-30 MED ORDER — LACTATED RINGERS IV BOLUS: 1000.0000 mL | Freq: Once | INTRAVENOUS | Status: DC

## 2021-12-30 NOTE — MAU Note (Signed)
.  Amy Villegas is a 31 y.o. at Unknown here in MAU reporting: bright red VB since 0000 when the pt went to the bathroom she saw it on her underwear and with wiping. Pt stated the bleeding stopped and occurred again 0400 this am same amount. Pt reports clotting that was small. Pt reporting having lower ABD cramping that's bilaterally that started shortly after the bleeding. Pt is wearing a pad, and in MAU bathroom saw blood with wiping. Pt took a home preg test 12/06/2021, and started her [facility]. Pt denies LOF and abnormal discharge.   EDD : 08/08/2022 Onset of complaint: 0000 Pain score: 5/10 Vitals:   12/30/21 0549  BP: 124/72  Pulse: 76  Resp: 18  Temp: 98.6 F (37 C)  SpO2: 99%   Lab orders placed from triage: UPT, UA

## 2021-12-30 NOTE — MAU Provider Note (Signed)
Chief Complaint: Vaginal Bleeding   None     SUBJECTIVE HPI: Amy Villegas is a 31 y.o. G3P2002 at [redacted]w[redacted]d by LMP who presents to maternity admissions reporting onset of bright red bleeding this morning when she wiped and blood in her underwear. She has on a pad and reports it is not saturated but the bleeding is like light menstrual bleeding with small clots.  There is associated abdominal cramping.    HPI  Past Medical History:  Diagnosis Date   Asthma    Past Surgical History:  Procedure Laterality Date   CESAREAN SECTION  2015   Social History   Socioeconomic History   Marital status: Single    Spouse name: Not on file   Number of children: Not on file   Years of education: Not on file   Highest education level: Not on file  Occupational History   Not on file  Tobacco Use   Smoking status: Never   Smokeless tobacco: Never  Vaping Use   Vaping Use: Never used  Substance and Sexual Activity   Alcohol use: No   Drug use: No   Sexual activity: Yes  Other Topics Concern   Not on file  Social History Narrative   Not on file   Social Determinants of Health   Financial Resource Strain: Not on file  Food Insecurity: Not on file  Transportation Needs: Not on file  Physical Activity: Not on file  Stress: Not on file  Social Connections: Not on file  Intimate Partner Violence: Not on file   No current facility-administered medications on file prior to encounter.   Current Outpatient Medications on File Prior to Encounter  Medication Sig Dispense Refill   Prenatal Vit-Fe Fumarate-FA (PRENATAL MULTIVITAMIN) TABS tablet Take 1 tablet by mouth daily at 12 noon.     dicyclomine (BENTYL) 20 MG tablet Take 1 tablet (20 mg total) by mouth 3 (three) times daily as needed for spasms (abdominal pain). 20 tablet 0   naproxen (NAPROSYN) 500 MG tablet Take 1 tablet (500 mg total) by mouth 2 (two) times daily as needed for mild pain or moderate pain. 30 tablet 0    ondansetron (ZOFRAN ODT) 4 MG disintegrating tablet Take 1 tablet (4 mg total) by mouth every 8 (eight) hours as needed for nausea or vomiting. 10 tablet 0   No Known Allergies  ROS:  Review of Systems  Constitutional:  Negative for chills, fatigue and fever.  Respiratory:  Negative for shortness of breath.   Cardiovascular:  Negative for chest pain.  Gastrointestinal:  Positive for abdominal pain.  Genitourinary:  Positive for vaginal bleeding. Negative for difficulty urinating, dysuria, flank pain, pelvic pain, vaginal discharge and vaginal pain.  Neurological:  Negative for dizziness and headaches.  Psychiatric/Behavioral: Negative.       I have reviewed patient's Past Medical Hx, Surgical Hx, Family Hx, Social Hx, medications and allergies.   Physical Exam  Patient Vitals for the past 24 hrs:  BP Temp Temp src Pulse Resp SpO2 Height Weight  12/30/21 0800 121/65 -- -- 65 -- -- -- --  12/30/21 0549 124/72 98.6 F (37 C) Oral 76 18 99 % 5\' 2"  (1.575 m) 75.5 kg   Constitutional: Well-developed, well-nourished female in no acute distress.  Cardiovascular: normal rate Respiratory: normal effort GI: Abd soft, non-tender. Pos BS x 4 MS: Extremities nontender, no edema, normal ROM Neurologic: Alert and oriented x 4.  GU: Neg CVAT.  PELVIC EXAM: wet prep/GCC collected by blind  swab    LAB RESULTS Results for orders placed or performed during the hospital encounter of 12/30/21 (from the past 24 hour(s))  Urinalysis, Routine w reflex microscopic     Status: Abnormal   Collection Time: 12/30/21  5:30 AM  Result Value Ref Range   Color, Urine YELLOW YELLOW   APPearance HAZY (A) CLEAR   Specific Gravity, Urine 1.020 1.005 - 1.030   pH 5.0 5.0 - 8.0   Glucose, UA NEGATIVE NEGATIVE mg/dL   Hgb urine dipstick LARGE (A) NEGATIVE   Bilirubin Urine NEGATIVE NEGATIVE   Ketones, ur NEGATIVE NEGATIVE mg/dL   Protein, ur NEGATIVE NEGATIVE mg/dL   Nitrite NEGATIVE NEGATIVE    Leukocytes,Ua NEGATIVE NEGATIVE   RBC / HPF >50 (H) 0 - 5 RBC/hpf   WBC, UA 0-5 0 - 5 WBC/hpf   Bacteria, UA RARE (A) NONE SEEN   Squamous Epithelial / LPF 11-20 0 - 5   Mucus PRESENT   Pregnancy, urine POC     Status: Abnormal   Collection Time: 12/30/21  5:34 AM  Result Value Ref Range   Preg Test, Ur POSITIVE (A) NEGATIVE  CBC     Status: None   Collection Time: 12/30/21  6:26 AM  Result Value Ref Range   WBC 9.2 4.0 - 10.5 K/uL   RBC 4.21 3.87 - 5.11 MIL/uL   Hemoglobin 12.4 12.0 - 15.0 g/dL   HCT 52.7 78.2 - 42.3 %   MCV 86.0 80.0 - 100.0 fL   MCH 29.5 26.0 - 34.0 pg   MCHC 34.3 30.0 - 36.0 g/dL   RDW 53.6 14.4 - 31.5 %   Platelets 209 150 - 400 K/uL   nRBC 0.0 0.0 - 0.2 %  hCG, quantitative, pregnancy     Status: Abnormal   Collection Time: 12/30/21  6:27 AM  Result Value Ref Range   hCG, Beta Chain, Quant, S 243,491 (H) <5 mIU/mL  ABO/Rh     Status: None   Collection Time: 12/30/21  6:27 AM  Result Value Ref Range   ABO/RH(D) O POS    No rh immune globuloin      NOT A RH IMMUNE GLOBULIN CANDIDATE, PT RH POSITIVE Performed at [facility], [address] [place], [place] [address]   Wet prep, genital     Status: None   Collection Time: 12/30/21  6:31 AM  Result Value Ref Range   Yeast Wet Prep HPF POC NONE SEEN NONE SEEN   Trich, Wet Prep NONE SEEN NONE SEEN   Clue Cells Wet Prep HPF POC NONE SEEN NONE SEEN   WBC, Wet Prep HPF POC <10 <10   Sperm NONE SEEN     --/--/O POS (07/13 7619)  IMAGING US OB Comp Less 14 Wks  Result Date: 12/30/2021 CLINICAL DATA:  First trimester bleeding and cramping. EXAM: OBSTETRIC <14 WK ULTRASOUND TECHNIQUE: Transabdominal ultrasound was performed for evaluation of the gestation as well as the maternal uterus and adnexal regions. COMPARISON:  None Available. FINDINGS: Intrauterine gestational sac: Single Yolk sac:  Visualized. Embryo:  Visualized. Cardiac Activity: Visualized. Heart Rate: 164 bpm MSD: Embryo visible, not  measured. CRL:   17.4 mm   8 w 1 d +/-5 days; [place] EDC: 08/10/2022 Subchorionic hemorrhage:  None visualized. Maternal uterus/adnexae: No uterine wall mass is seen. The cervix is not well evaluated due to bowel gas shadowing but it does appear closed. Both ovaries are normal in size and both show color flow. No free  fluid is seen. There is no appreciable adnexal mass. IMPRESSION: 1. Living single IUP measuring 8 weeks 1 day, sonographic EDC 08/10/2022. 2. No subchorionic hemorrhage or cervical shortening. 3. Unremarkable ovaries and adnexal spaces. Electronically Signed   By: Almira Bar M.D.   On: 12/30/2021 07:43    MAU Management/MDM: Orders Placed This Encounter  Procedures   Wet prep, genital   US OB Comp Less 14 Wks   Urinalysis, Routine w reflex microscopic   CBC   hCG, quantitative, pregnancy   Pregnancy, urine POC   ABO/Rh   Discharge patient    Meds ordered this encounter  Medications   DISCONTD: lactated ringers bolus 1,000 mL    US shows viable IUP, no subchorionic hemorrhage noted.  Discussed results with pt. Bleeding precautions given.  Begin prenatal care as planned.    ASSESSMENT 1. Vaginal bleeding in pregnancy, first trimester   2. Normal IUP (intrauterine pregnancy) on prenatal ultrasound, first trimester   3. [redacted] weeks gestation of pregnancy     PLAN Discharge home Allergies as of 12/30/2021   No Known Allergies      Medication List     STOP taking these medications    naproxen 500 MG tablet Commonly known as: NAPROSYN       TAKE these medications    dicyclomine 20 MG tablet Commonly known as: BENTYL Take 1 tablet (20 mg total) by mouth 3 (three) times daily as needed for spasms (abdominal pain).   ondansetron 4 MG disintegrating tablet Commonly known as: Zofran ODT Take 1 tablet (4 mg total) by mouth every 8 (eight) hours as needed for nausea or vomiting.   prenatal multivitamin Tabs tablet Take 1 tablet by mouth daily at 12 noon.         Follow-up Information     Edwinna Areola, DO Follow up.   Specialty: Obstetrics and Gynecology Why: As scheduled Contact information: [address] [place] [address] [place] [address] [phone]         [facility] Follow up.   Specialty: Obstetrics and Gynecology Why: As needed for emergencies Contact information: [address] [id number] Wilhemina Bonito [place] [address] [phone]                Sharen Counter Certified Nurse-Midwife 12/30/2021  8:04 AM

## 2022-01-05 LAB — OB RESULTS CONSOLE RUBELLA ANTIBODY, IGM: Rubella: IMMUNE

## 2022-01-05 LAB — OB RESULTS CONSOLE HIV ANTIBODY (ROUTINE TESTING): HIV: NONREACTIVE

## 2022-01-05 LAB — HEPATITIS C ANTIBODY: HCV Ab: NEGATIVE

## 2022-01-05 LAB — OB RESULTS CONSOLE HEPATITIS B SURFACE ANTIGEN: Hepatitis B Surface Ag: NEGATIVE

## 2022-03-07 IMAGING — CT CT ABD-PELV W/ CM
2 of 4 series · 16 of 46 positions shown, 18 images · IV contrast (Omnipaque or Isovue)
Comparison: None.

CLINICAL DATA: Right lower quadrant pain, appendicitis suspected,
pain for 2 days

EXAM:
CT ABDOMEN AND PELVIS WITH CONTRAST
TECHNIQUE: Multidetector CT imaging of the abdomen and pelvis was performed
using the standard protocol following bolus administration of
intravenous contrast.
CONTRAST:  80 cc OMNIPAQUE IOHEXOL 300 MG/ML  SOLN

[Series 2: axial st · axial · 0.87mm/px · z∈[+515,+905]mm · 13 of 86 slices shown, 15 images]
[im 4/86  soft-tissue]
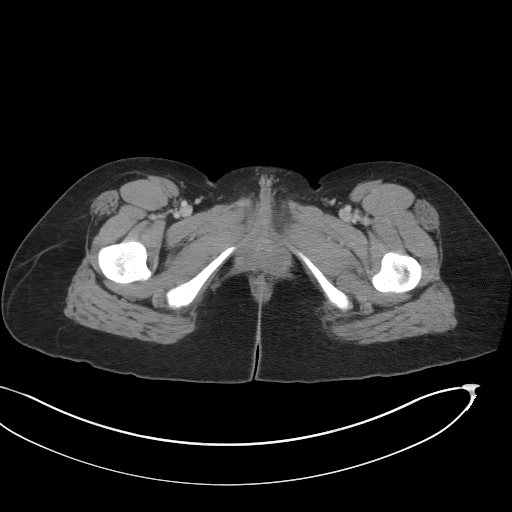
[im 4/86  bone]
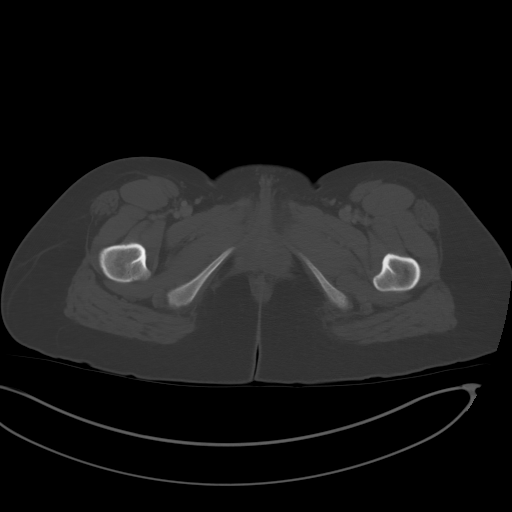
[im 12/86  soft-tissue]
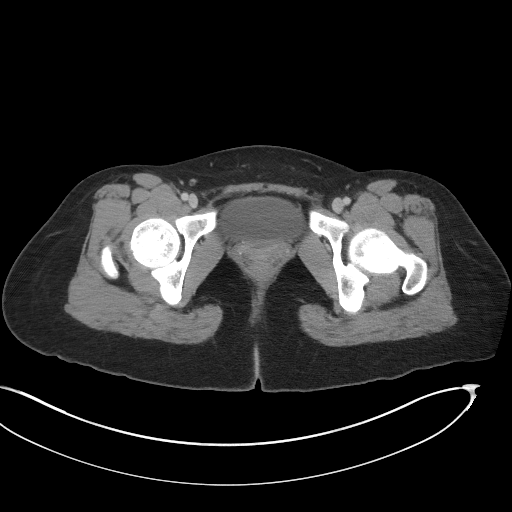
[im 19/86  soft-tissue]
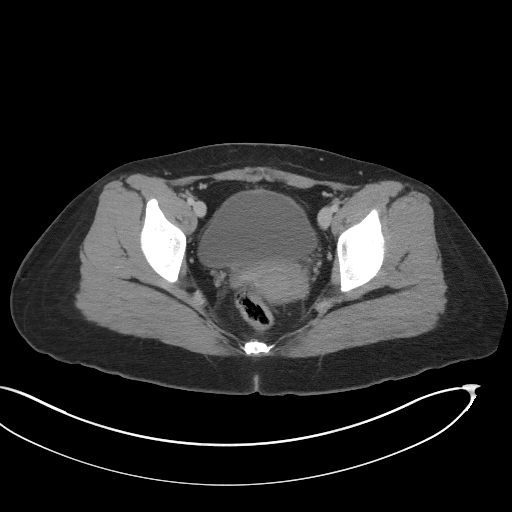
[im 23/86  soft-tissue]
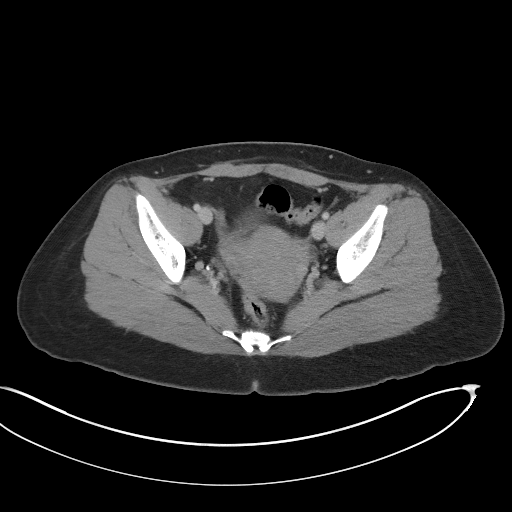
[im 30/86  soft-tissue]
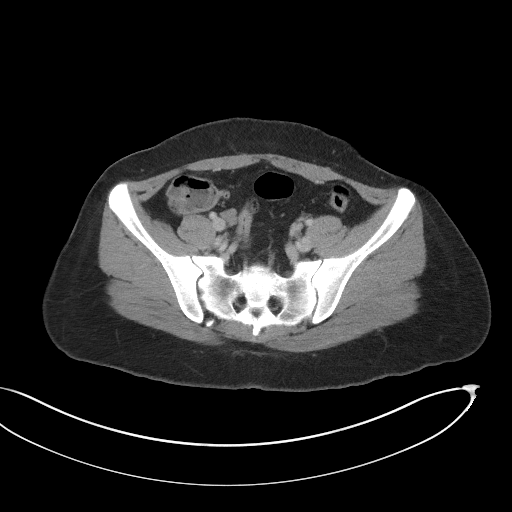
[im 37/86  soft-tissue]
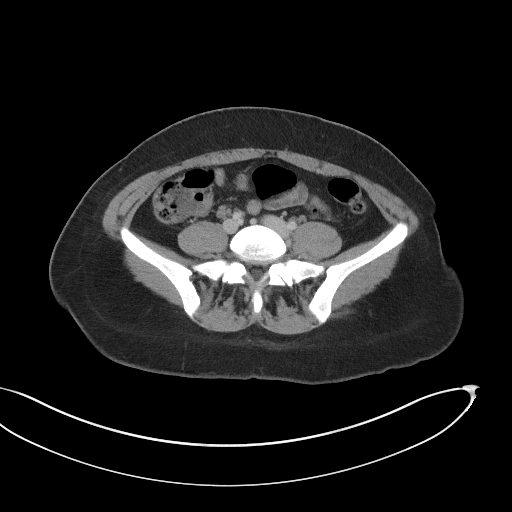
[im 45/86  soft-tissue]
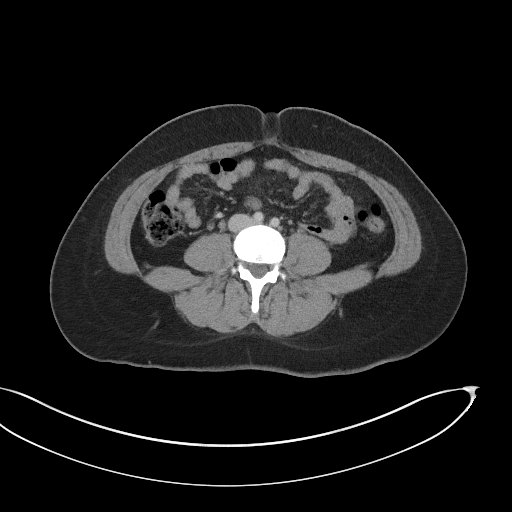
[im 49/86  soft-tissue]
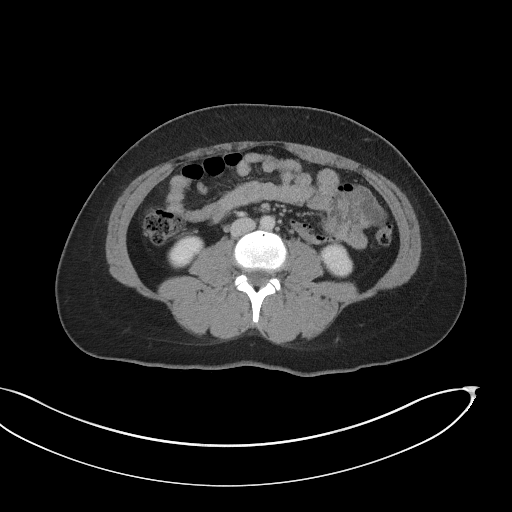
[im 56/86  soft-tissue]
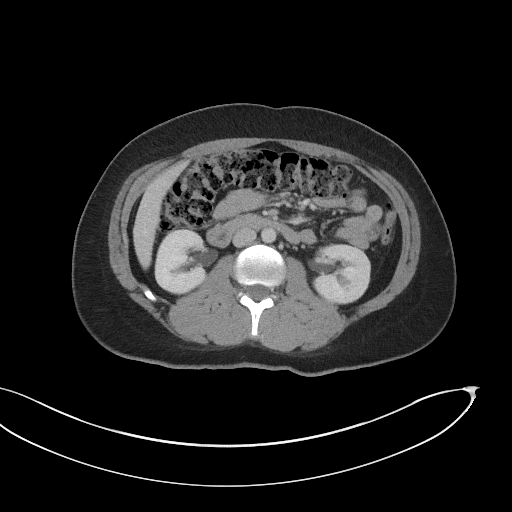
[im 56/86  bone]
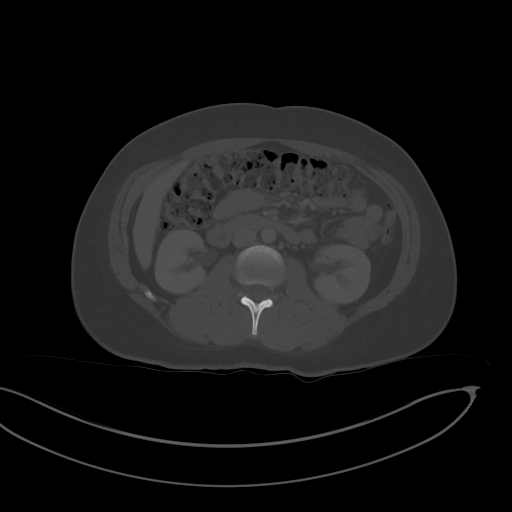
[im 63/86  soft-tissue]
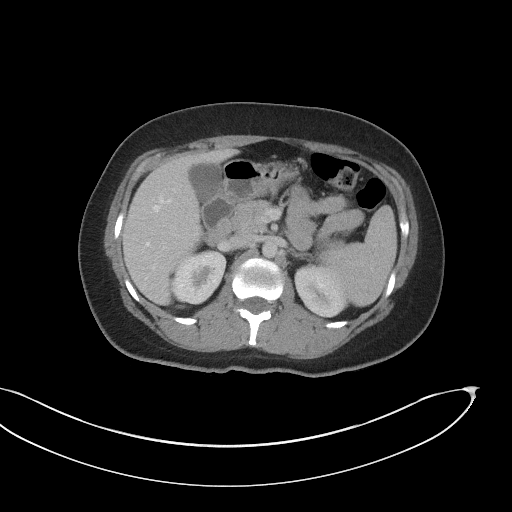
[im 67/86  soft-tissue]
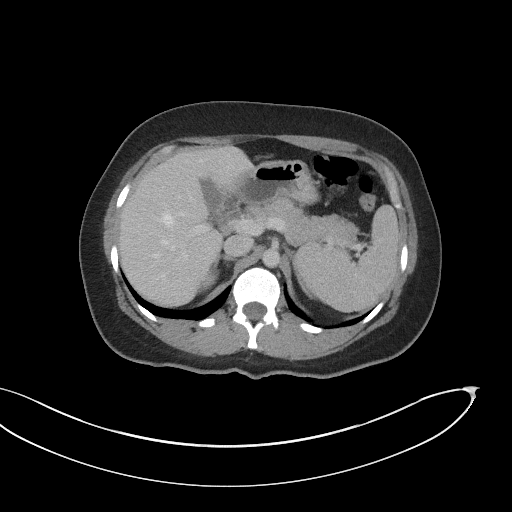
[im 74/86  soft-tissue]
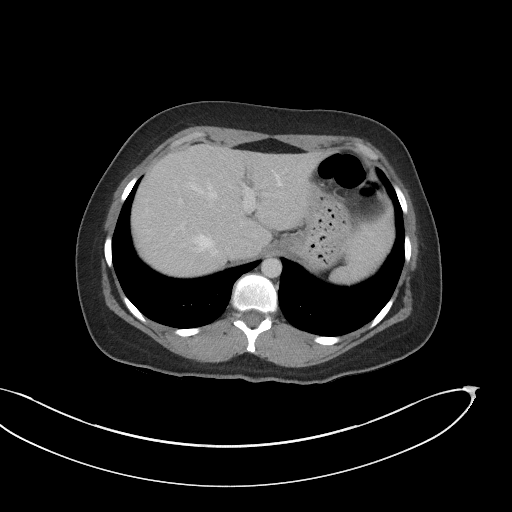
[im 82/86  soft-tissue]
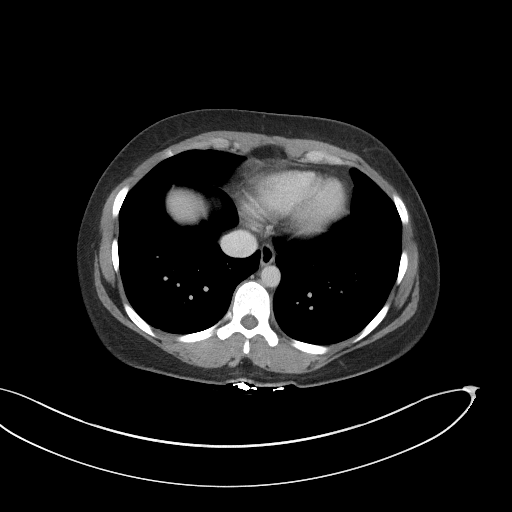

[Series 5: coronal st · coronal · 0.80mm/px · 3 of 91 slices shown]
[im 31/91  soft-tissue]
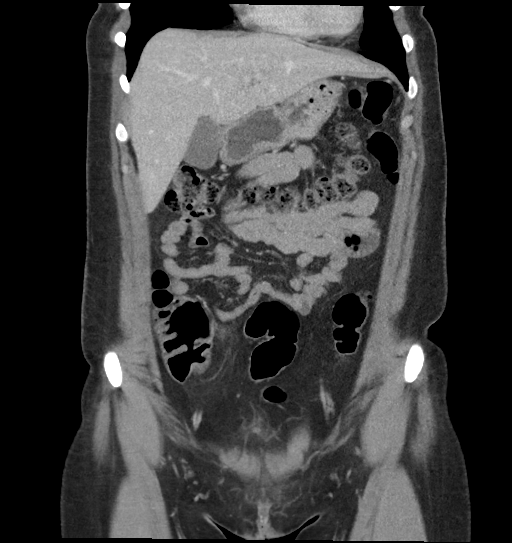
[im 41/91  soft-tissue]
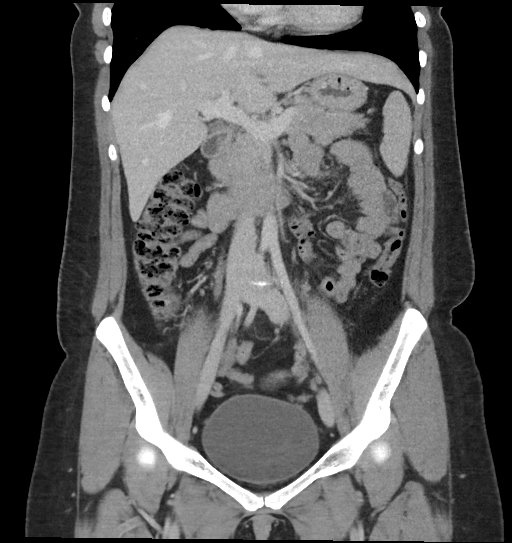
[im 51/91  soft-tissue]
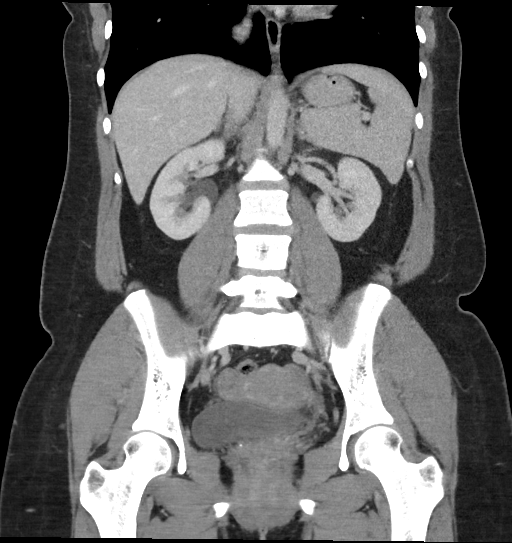

[16 of 46 positions shown; findings below may reference images not displayed]

FINDINGS: Lower chest: Lung bases are clear. Normal heart size. No pericardial
effusion.

Hepatobiliary: No worrisome focal liver lesions. Smooth liver
surface contour. Normal hepatic attenuation. Normal gallbladder and
biliary tree.

Pancreas: No pancreatic ductal dilatation or surrounding
inflammatory changes.

Spleen: Normal in size. No concerning splenic lesions.

Adrenals/Urinary Tract: Normal adrenal glands. Kidneys are normally
located with symmetric enhancement. No suspicious renal lesion,
urolithiasis or hydronephrosis. Urinary bladder is unremarkable.

Stomach/Bowel: Distal esophagus, stomach and duodenal sweep are
unremarkable. No small bowel wall thickening or dilatation. No
evidence of obstruction. Normal air-filled appendix seen coursing
from the tip of the cecum to the pelvis/right adnexa. No focal
periappendiceal stranding or dilatation. No extraluminal gas or
organized collection. No colonic dilatation or wall thickening.

Vascular/Lymphatic: No significant vascular findings are present. No
enlarged abdominal or pelvic lymph nodes.

Reproductive: Anteverted uterus.  No concerning adnexal lesions.

Other: No abdominopelvic free air or free fluid. No bowel containing
hernia. Small fat containing umbilical hernia.

Musculoskeletal: No acute osseous abnormality or suspicious osseous
lesion. Musculature is normal and symmetric.
IMPRESSION: 1. No evidence of acute appendicitis. Normal appendix in the right
lower quadrant coursing towards the pelvic sidewall/right adnexa.
2. No other acute abnormality in the abdomen or pelvis to provide a
cause for patient's symptoms.

## 2022-06-20 NOTE — L&D Delivery Note (Signed)
Patient was C/C/+2 and pushed for 1hr 10 minutes with epidural.    NSVD  female infant, Apgars 8/9, weight pending.   The patient had no laceration. Fundus was firm. EBL was expected amount. Placenta was delivered intact. Vagina was clear.  Delayed cord clamping done for 30-60 seconds while warming baby. Baby was vigorous and doing skin to skin with mother.  Allyn Kenner

## 2022-06-28 ENCOUNTER — Encounter (HOSPITAL_COMMUNITY): Payer: Self-pay

## 2022-06-28 ENCOUNTER — Other Ambulatory Visit: Payer: Self-pay

## 2022-06-28 ENCOUNTER — Observation Stay (HOSPITAL_COMMUNITY)
Admission: EM | Admit: 2022-06-28 | Discharge: 2022-06-29 | Disposition: A | Discharge status: Home / Self Care | Payer: Medicaid Other | Attending: Obstetrics and Gynecology | Admitting: Obstetrics and Gynecology

## 2022-06-28 ENCOUNTER — Emergency Department (HOSPITAL_COMMUNITY): Payer: Medicaid Other

## 2022-06-28 DIAGNOSIS — O26893 Other specified pregnancy related conditions, third trimester: Secondary | ICD-10-CM | POA: Diagnosis not present

## 2022-06-28 DIAGNOSIS — J45909 Unspecified asthma, uncomplicated: Secondary | ICD-10-CM | POA: Diagnosis not present

## 2022-06-28 DIAGNOSIS — O99513 Diseases of the respiratory system complicating pregnancy, third trimester: Secondary | ICD-10-CM | POA: Insufficient documentation

## 2022-06-28 DIAGNOSIS — R1032 Left lower quadrant pain: Secondary | ICD-10-CM | POA: Insufficient documentation

## 2022-06-28 DIAGNOSIS — Z3A34 34 weeks gestation of pregnancy: Secondary | ICD-10-CM | POA: Insufficient documentation

## 2022-06-28 DIAGNOSIS — O9A213 Injury, poisoning and certain other consequences of external causes complicating pregnancy, third trimester: Principal | ICD-10-CM | POA: Insufficient documentation

## 2022-06-28 DIAGNOSIS — S36039A Unspecified laceration of spleen, initial encounter: Secondary | ICD-10-CM | POA: Diagnosis not present

## 2022-06-28 DIAGNOSIS — R1012 Left upper quadrant pain: Secondary | ICD-10-CM | POA: Insufficient documentation

## 2022-06-28 DIAGNOSIS — S7002XA Contusion of left hip, initial encounter: Secondary | ICD-10-CM | POA: Insufficient documentation

## 2022-06-28 HISTORY — DX: Unspecified laceration of spleen, initial encounter: S36.039A

## 2022-06-28 LAB — LACTIC ACID, PLASMA: Lactic Acid, Venous: 2 mmol/L (ref 0.5–1.9)

## 2022-06-28 LAB — CBC WITH DIFFERENTIAL/PLATELET
Abs Immature Granulocytes: 0.13 10*3/uL — ABNORMAL HIGH (ref 0.00–0.07)
Basophils Absolute: 0.1 10*3/uL (ref 0.0–0.1)
Basophils Relative: 0 %
Eosinophils Absolute: 0.2 10*3/uL (ref 0.0–0.5)
Eosinophils Relative: 1 %
HCT: 39.3 % (ref 36.0–46.0)
Hemoglobin: 13.6 g/dL (ref 12.0–15.0)
Immature Granulocytes: 1 %
Lymphocytes Relative: 18 %
Lymphs Abs: 2.3 10*3/uL (ref 0.7–4.0)
MCH: 30.6 pg (ref 26.0–34.0)
MCHC: 34.6 g/dL (ref 30.0–36.0)
MCV: 88.3 fL (ref 80.0–100.0)
Monocytes Absolute: 0.6 10*3/uL (ref 0.1–1.0)
Monocytes Relative: 5 %
Neutro Abs: 9.2 10*3/uL — ABNORMAL HIGH (ref 1.7–7.7)
Neutrophils Relative %: 75 %
Platelets: 177 10*3/uL (ref 150–400)
RBC: 4.45 MIL/uL (ref 3.87–5.11)
RDW: 13.7 % (ref 11.5–15.5)
WBC: 12.5 10*3/uL — ABNORMAL HIGH (ref 4.0–10.5)
nRBC: 0 % (ref 0.0–0.2)

## 2022-06-28 LAB — I-STAT CHEM 8, ED
BUN: 8 mg/dL (ref 6–20)
Calcium, Ion: 1.19 mmol/L (ref 1.15–1.40)
Chloride: 106 mmol/L (ref 98–111)
Creatinine, Ser: 0.4 mg/dL — ABNORMAL LOW (ref 0.44–1.00)
Glucose, Bld: 91 mg/dL (ref 70–99)
HCT: 38 % (ref 36.0–46.0)
Hemoglobin: 12.9 g/dL (ref 12.0–15.0)
Potassium: 3.9 mmol/L (ref 3.5–5.1)
Sodium: 137 mmol/L (ref 135–145)
TCO2: 20 mmol/L — ABNORMAL LOW (ref 22–32)

## 2022-06-28 LAB — CBC
HCT: 36.3 % (ref 36.0–46.0)
HCT: 37.5 % (ref 36.0–46.0)
Hemoglobin: 12.7 g/dL (ref 12.0–15.0)
Hemoglobin: 12.6 g/dL (ref 12.0–15.0)
MCH: 30.5 pg (ref 26.0–34.0)
MCH: 30.1 pg (ref 26.0–34.0)
MCHC: 35 g/dL (ref 30.0–36.0)
MCHC: 33.6 g/dL (ref 30.0–36.0)
MCV: 87.1 fL (ref 80.0–100.0)
MCV: 89.5 fL (ref 80.0–100.0)
Platelets: 171 10*3/uL (ref 150–400)
Platelets: 187 10*3/uL (ref 150–400)
RBC: 4.17 MIL/uL (ref 3.87–5.11)
RBC: 4.19 MIL/uL (ref 3.87–5.11)
RDW: 13.7 % (ref 11.5–15.5)
RDW: 13.9 % (ref 11.5–15.5)
WBC: 12.4 10*3/uL — ABNORMAL HIGH (ref 4.0–10.5)
WBC: 12.5 10*3/uL — ABNORMAL HIGH (ref 4.0–10.5)
nRBC: 0 % (ref 0.0–0.2)
nRBC: 0 % (ref 0.0–0.2)

## 2022-06-28 LAB — KLEIHAUER-BETKE STAIN
Fetal Cells %: 0 %
Quantitation Fetal Hemoglobin: 0 mL

## 2022-06-28 LAB — LIPASE, BLOOD: Lipase: 30 U/L (ref 11–51)

## 2022-06-28 LAB — COMPREHENSIVE METABOLIC PANEL
ALT: 12 U/L (ref 0–44)
AST: 23 U/L (ref 15–41)
Albumin: 2.8 g/dL — ABNORMAL LOW (ref 3.5–5.0)
Alkaline Phosphatase: 135 U/L — ABNORMAL HIGH (ref 38–126)
Anion gap: 11 (ref 5–15)
BUN: 8 mg/dL (ref 6–20)
CO2: 19 mmol/L — ABNORMAL LOW (ref 22–32)
Calcium: 8.8 mg/dL — ABNORMAL LOW (ref 8.9–10.3)
Chloride: 105 mmol/L (ref 98–111)
Creatinine, Ser: 0.62 mg/dL (ref 0.44–1.00)
GFR, Estimated: >60 mL/min (ref >60)
Glucose, Bld: 91 mg/dL (ref 70–99)
Potassium: 3.9 mmol/L (ref 3.5–5.1)
Sodium: 135 mmol/L (ref 135–145)
Total Bilirubin: 0.2 mg/dL — ABNORMAL LOW (ref 0.3–1.2)
Total Protein: 7.4 g/dL (ref 6.5–8.1)

## 2022-06-28 LAB — TYPE AND SCREEN
ABO/RH(D): O POS
Antibody Screen: NEGATIVE

## 2022-06-28 MED ORDER — IOHEXOL 350 MG/ML SOLN
75.0000 mL | Freq: Once | INTRAVENOUS | Status: AC | PRN
  Administered 2022-06-28: 75 mL via INTRAVENOUS

## 2022-06-28 MED ORDER — PRENATAL MULTIVITAMIN CH
1.0000 | ORAL_TABLET | Freq: Every day | ORAL | Status: DC
  Administered 2022-06-28 – 2022-06-29 (×2): 1 via ORAL
  Filled 2022-06-28 (×2): qty 1

## 2022-06-28 MED ORDER — DOCUSATE SODIUM 100 MG PO CAPS
100.0000 mg | ORAL_CAPSULE | Freq: Every day | ORAL | Status: DC
  Administered 2022-06-28 – 2022-06-29 (×2): 100 mg via ORAL
  Filled 2022-06-28 (×2): qty 1

## 2022-06-28 MED ORDER — LACTATED RINGERS IV BOLUS
500.0000 mL | Freq: Once | INTRAVENOUS | Status: AC
  Administered 2022-06-28: 500 mL via INTRAVENOUS

## 2022-06-28 MED ORDER — SODIUM CHLORIDE 0.9 % IV BOLUS
1000.0000 mL | Freq: Once | INTRAVENOUS | Status: AC
  Administered 2022-06-28: 1000 mL via INTRAVENOUS

## 2022-06-28 MED ORDER — ACETAMINOPHEN 500 MG PO TABS
1000.0000 mg | ORAL_TABLET | Freq: Once | ORAL | Status: AC
Start: 2022-06-28 — End: 2022-06-28
  Administered 2022-06-28: 1000 mg via ORAL
  Filled 2022-06-28: qty 2

## 2022-06-28 MED ORDER — FENTANYL CITRATE (PF) 100 MCG/2ML IJ SOLN
25.0000 ug | INTRAMUSCULAR | Status: DC | PRN
  Administered 2022-06-28: 25 ug via INTRAVENOUS
  Filled 2022-06-28: qty 2

## 2022-06-28 MED ORDER — ACETAMINOPHEN 325 MG PO TABS: 650.0000 mg | ORAL_TABLET | ORAL | Status: DC | PRN

## 2022-06-28 MED ORDER — PRENATAL MULTIVITAMIN CH: 1.0000 | ORAL_TABLET | Freq: Every day | ORAL | Status: DC

## 2022-06-28 MED ORDER — LACTATED RINGERS IV SOLN: INTRAVENOUS | Status: DC

## 2022-06-28 MED ORDER — LACTATED RINGERS IV SOLN: 125.0000 mL/h | INTRAVENOUS | Status: DC

## 2022-06-28 MED ORDER — CALCIUM CARBONATE ANTACID 500 MG PO CHEW: 2.0000 | CHEWABLE_TABLET | ORAL | Status: DC | PRN

## 2022-06-28 NOTE — ED Notes (Signed)
Patient taken to [facility] speciality care by [facility] nurse.

## 2022-06-28 NOTE — TOC CAGE-AID Note (Signed)
Transition of Care [facility]) - CAGE-AID Screening  Patient Details  Name: Amy Villegas MRN: [id number] Date of Birth: 11-24-1990  Clinical Narrative:  Patient denies any alcohol or drug use, no need for substance abuse resources at this time.  CAGE-AID Screening:    Have You Ever Felt You Ought to Cut Down on Your Drinking or Drug Use?: No Have People Annoyed You By Critizing Your Drinking Or Drug Use?: No Have You Felt Bad Or Guilty About Your Drinking Or Drug Use?: No Have You Ever Had a Drink or Used Drugs First Thing In The Morning to Steady Your Nerves or to Get Rid of a Hangover?: No CAGE-AID Score: 0  Substance Abuse Education Offered: No
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
## 2022-06-28 NOTE — Progress Notes (Signed)
Patient to CT scan via stretcher.
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
## 2022-06-28 NOTE — Progress Notes (Signed)
Patient back from CT.  Monitors applied for fetal monitoring.  Dr Londell Moh updated on status.  No new orders.  Will be by to check on patient.  If cleared medically, transfer to MAU for continued monitoring.  Pearletha Forge RNC-OB RROB [phone]

## 2022-06-29 LAB — CBC
HCT: 31.9 % — ABNORMAL LOW (ref 36.0–46.0)
Hemoglobin: 11.1 g/dL — ABNORMAL LOW (ref 12.0–15.0)
MCH: 30.5 pg (ref 26.0–34.0)
MCHC: 34.8 g/dL (ref 30.0–36.0)
MCV: 87.6 fL (ref 80.0–100.0)
Platelets: 163 10*3/uL (ref 150–400)
RBC: 3.64 MIL/uL — ABNORMAL LOW (ref 3.87–5.11)
RDW: 13.8 % (ref 11.5–15.5)
WBC: 8.7 10*3/uL (ref 4.0–10.5)
nRBC: 0 % (ref 0.0–0.2)

## 2022-06-29 NOTE — Progress Notes (Signed)
   06/29/22 1210  Departure Condition  Departure Condition Good  Mobility at [facility]  Patient/Caregiver Teaching Teach Back Method Used;Discharge instructions reviewed;Follow-up care reviewed;Patient/caregiver verbalized understanding  Departure Mode With significant other  Was procedural sedation performed on this patient during this visit? No   Patient alert and oriented x4, VS and pain stable.

## 2022-06-29 NOTE — Plan of Care (Signed)
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
## 2022-07-12 LAB — OB RESULTS CONSOLE GBS: GBS: NEGATIVE

## 2022-07-27 ENCOUNTER — Inpatient Hospital Stay (HOSPITAL_COMMUNITY)
Admission: AD | Admit: 2022-07-27 | Discharge: 2022-07-30 | DRG: 807 | Disposition: A | Discharge status: Home / Self Care | Payer: Medicaid Other | Attending: Obstetrics and Gynecology | Admitting: Obstetrics and Gynecology

## 2022-07-27 ENCOUNTER — Other Ambulatory Visit: Payer: Self-pay

## 2022-07-27 ENCOUNTER — Encounter (HOSPITAL_COMMUNITY): Payer: Self-pay | Admitting: Obstetrics and Gynecology

## 2022-07-27 ENCOUNTER — Inpatient Hospital Stay (HOSPITAL_COMMUNITY): Payer: Medicaid Other | Admitting: Anesthesiology

## 2022-07-27 DIAGNOSIS — O1414 Severe pre-eclampsia complicating childbirth: Principal | ICD-10-CM | POA: Diagnosis present

## 2022-07-27 DIAGNOSIS — O34219 Maternal care for unspecified type scar from previous cesarean delivery: Secondary | ICD-10-CM | POA: Diagnosis present

## 2022-07-27 DIAGNOSIS — Z3A38 38 weeks gestation of pregnancy: Secondary | ICD-10-CM | POA: Diagnosis not present

## 2022-07-27 DIAGNOSIS — O139 Gestational [pregnancy-induced] hypertension without significant proteinuria, unspecified trimester: Principal | ICD-10-CM | POA: Diagnosis present

## 2022-07-27 DIAGNOSIS — R030 Elevated blood-pressure reading, without diagnosis of hypertension: Secondary | ICD-10-CM | POA: Diagnosis present

## 2022-07-27 LAB — CBC
HCT: 37.1 % (ref 36.0–46.0)
Hemoglobin: 13 g/dL (ref 12.0–15.0)
MCH: 30.7 pg (ref 26.0–34.0)
MCHC: 35 g/dL (ref 30.0–36.0)
MCV: 87.7 fL (ref 80.0–100.0)
Platelets: 162 K/uL (ref 150–400)
RBC: 4.23 MIL/uL (ref 3.87–5.11)
RDW: 13.5 % (ref 11.5–15.5)
WBC: 8 K/uL (ref 4.0–10.5)
nRBC: 0 % (ref 0.0–0.2)

## 2022-07-27 LAB — COMPREHENSIVE METABOLIC PANEL
ALT: 13 U/L (ref 0–44)
AST: 22 U/L (ref 15–41)
Albumin: 2.7 g/dL — ABNORMAL LOW (ref 3.5–5.0)
Alkaline Phosphatase: 166 U/L — ABNORMAL HIGH (ref 38–126)
Anion gap: 11 (ref 5–15)
BUN: 9 mg/dL (ref 6–20)
CO2: 20 mmol/L — ABNORMAL LOW (ref 22–32)
Calcium: 9.3 mg/dL (ref 8.9–10.3)
Chloride: 105 mmol/L (ref 98–111)
Creatinine, Ser: 0.69 mg/dL (ref 0.44–1.00)
GFR, Estimated: >60 mL/min (ref >60)
Glucose, Bld: 71 mg/dL (ref 70–99)
Potassium: 3.8 mmol/L (ref 3.5–5.1)
Sodium: 136 mmol/L (ref 135–145)
Total Bilirubin: 0.2 mg/dL — ABNORMAL LOW (ref 0.3–1.2)
Total Protein: 6 g/dL — ABNORMAL LOW (ref 6.5–8.1)

## 2022-07-27 LAB — HIV ANTIBODY (ROUTINE TESTING W REFLEX): HIV Screen 4th Generation wRfx: NONREACTIVE

## 2022-07-27 LAB — TYPE AND SCREEN
ABO/RH(D): O POS
Antibody Screen: NEGATIVE

## 2022-07-27 MED ORDER — LABETALOL HCL 5 MG/ML IV SOLN: 40.0000 mg | INTRAVENOUS | Status: DC | PRN

## 2022-07-27 MED ORDER — EPHEDRINE 5 MG/ML INJ
10.0000 mg | INTRAVENOUS | Status: DC | PRN
  Filled 2022-07-27: qty 5

## 2022-07-27 MED ORDER — FENTANYL-BUPIVACAINE-NACL 0.5-0.125-0.9 MG/250ML-% EP SOLN
EPIDURAL | Status: AC
  Filled 2022-07-27: qty 250

## 2022-07-27 MED ORDER — LORAZEPAM 2 MG/ML IJ SOLN
INTRAMUSCULAR | Status: AC
  Filled 2022-07-27: qty 1

## 2022-07-27 MED ORDER — SOD CITRATE-CITRIC ACID 500-334 MG/5ML PO SOLN: 30.0000 mL | ORAL | Status: DC | PRN

## 2022-07-27 MED ORDER — OXYTOCIN BOLUS FROM INFUSION
333.0000 mL | Freq: Once | INTRAVENOUS | Status: AC
  Administered 2022-07-28: 333 mL via INTRAVENOUS

## 2022-07-27 MED ORDER — ACETAMINOPHEN 500 MG PO TABS
1000.0000 mg | ORAL_TABLET | Freq: Once | ORAL | Status: AC
  Administered 2022-07-27: 1000 mg via ORAL
  Filled 2022-07-27: qty 2

## 2022-07-27 MED ORDER — LACTATED RINGERS IV SOLN: INTRAVENOUS | Status: DC

## 2022-07-27 MED ORDER — LABETALOL HCL 5 MG/ML IV SOLN
40.0000 mg | INTRAVENOUS | Status: DC | PRN
  Filled 2022-07-27: qty 8

## 2022-07-27 MED ORDER — HYDRALAZINE HCL 20 MG/ML IJ SOLN: 10.0000 mg | INTRAMUSCULAR | Status: DC | PRN

## 2022-07-27 MED ORDER — NIFEDIPINE ER OSMOTIC RELEASE 30 MG PO TB24
30.0000 mg | ORAL_TABLET | Freq: Every day | ORAL | Status: AC
  Administered 2022-07-27: 30 mg via ORAL
  Filled 2022-07-27: qty 1

## 2022-07-27 MED ORDER — OXYCODONE-ACETAMINOPHEN 5-325 MG PO TABS: 2.0000 | ORAL_TABLET | ORAL | Status: DC | PRN

## 2022-07-27 MED ORDER — FENTANYL-BUPIVACAINE-NACL 0.5-0.125-0.9 MG/250ML-% EP SOLN
12.0000 mL/h | EPIDURAL | Status: DC | PRN
  Administered 2022-07-27: 12 mL/h via EPIDURAL

## 2022-07-27 MED ORDER — PHENYLEPHRINE 80 MCG/ML (10ML) SYRINGE FOR IV PUSH (FOR BLOOD PRESSURE SUPPORT): 80.0000 ug | PREFILLED_SYRINGE | INTRAVENOUS | Status: DC | PRN

## 2022-07-27 MED ORDER — EPHEDRINE 5 MG/ML INJ: 10.0000 mg | INTRAVENOUS | Status: DC | PRN

## 2022-07-27 MED ORDER — FENTANYL-BUPIVACAINE-NACL 0.5-0.125-0.9 MG/250ML-% EP SOLN: 12.0000 mL/h | EPIDURAL | Status: DC | PRN

## 2022-07-27 MED ORDER — LABETALOL HCL 5 MG/ML IV SOLN
80.0000 mg | INTRAVENOUS | Status: DC | PRN
  Administered 2022-07-27: 80 mg via INTRAVENOUS
  Filled 2022-07-27: qty 16

## 2022-07-27 MED ORDER — ACETAMINOPHEN 325 MG PO TABS
650.0000 mg | ORAL_TABLET | ORAL | Status: DC | PRN
  Administered 2022-07-27: 650 mg via ORAL
  Filled 2022-07-27: qty 2

## 2022-07-27 MED ORDER — OXYTOCIN-SODIUM CHLORIDE 30-0.9 UT/500ML-% IV SOLN
1.0000 m[IU]/min | INTRAVENOUS | Status: DC
  Administered 2022-07-27: 1 m[IU]/min via INTRAVENOUS

## 2022-07-27 MED ORDER — LACTATED RINGERS IV SOLN: 500.0000 mL | Freq: Once | INTRAVENOUS | Status: DC

## 2022-07-27 MED ORDER — TERBUTALINE SULFATE 1 MG/ML IJ SOLN
0.2500 mg | Freq: Once | INTRAMUSCULAR | Status: DC | PRN
  Filled 2022-07-27: qty 1

## 2022-07-27 MED ORDER — LABETALOL HCL 5 MG/ML IV SOLN
20.0000 mg | INTRAVENOUS | Status: DC | PRN
  Administered 2022-07-27: 20 mg via INTRAVENOUS

## 2022-07-27 MED ORDER — LABETALOL HCL 5 MG/ML IV SOLN: 20.0000 mg | INTRAVENOUS | Status: DC | PRN

## 2022-07-27 MED ORDER — MAGNESIUM SULFATE 40 GM/1000ML IV SOLN
2.0000 g/h | INTRAVENOUS | Status: DC
  Filled 2022-07-27: qty 1000

## 2022-07-27 MED ORDER — LABETALOL HCL 5 MG/ML IV SOLN
INTRAVENOUS | Status: AC
  Administered 2022-07-27: 40 mg via INTRAVENOUS
  Filled 2022-07-27: qty 4

## 2022-07-27 MED ORDER — EPHEDRINE 5 MG/ML INJ
10.0000 mg | INTRAVENOUS | Status: DC | PRN
  Administered 2022-07-27: 5 mg via INTRAVENOUS

## 2022-07-27 MED ORDER — OXYTOCIN-SODIUM CHLORIDE 30-0.9 UT/500ML-% IV SOLN
2.5000 [IU]/h | INTRAVENOUS | Status: DC
  Filled 2022-07-27: qty 500

## 2022-07-27 MED ORDER — DIPHENHYDRAMINE HCL 50 MG/ML IJ SOLN: 12.5000 mg | INTRAMUSCULAR | Status: DC | PRN

## 2022-07-27 MED ORDER — ONDANSETRON HCL 4 MG/2ML IJ SOLN: 4.0000 mg | Freq: Four times a day (QID) | INTRAMUSCULAR | Status: DC | PRN

## 2022-07-27 MED ORDER — LACTATED RINGERS IV SOLN: 500.0000 mL | INTRAVENOUS | Status: DC | PRN

## 2022-07-27 MED ORDER — LABETALOL HCL 5 MG/ML IV SOLN: 80.0000 mg | INTRAVENOUS | Status: DC | PRN

## 2022-07-27 MED ORDER — MAGNESIUM SULFATE BOLUS VIA INFUSION
4.0000 g | Freq: Once | INTRAVENOUS | Status: AC
  Administered 2022-07-27: 4 g via INTRAVENOUS
  Filled 2022-07-27: qty 1000

## 2022-07-27 MED ORDER — FENTANYL CITRATE (PF) 100 MCG/2ML IJ SOLN
50.0000 ug | INTRAMUSCULAR | Status: DC | PRN
  Administered 2022-07-27: 50 ug via INTRAVENOUS
  Filled 2022-07-27: qty 2

## 2022-07-27 MED ORDER — LIDOCAINE-EPINEPHRINE (PF) 2 %-1:200000 IJ SOLN
INTRAMUSCULAR | Status: DC | PRN
  Administered 2022-07-27: 5 mL via EPIDURAL

## 2022-07-27 MED ORDER — OXYCODONE-ACETAMINOPHEN 5-325 MG PO TABS: 1.0000 | ORAL_TABLET | ORAL | Status: DC | PRN

## 2022-07-27 MED ORDER — LIDOCAINE HCL (PF) 1 % IJ SOLN: 30.0000 mL | INTRAMUSCULAR | Status: DC | PRN

## 2022-07-27 NOTE — Progress Notes (Signed)
Upon arrival  approx 4:30pm pt reporting severe HA, finding it difficult to even talk.  Administered total of 20, 40, 80 IV labetalol and Procardia XL 30mg  x 1.  MgSO4 started.  Tylenool 100mg  administered.  BP reduced to mild range from 190s/100s.  Discussed with patient early epidural and pt consented.  Reevaluated pt at 7pm after epidural and pt was on scrolling on phone.  Reported HA much improved, no complaints.   FHT 130 Cat 1 TOCO occ SVE: 4/50/-2 AROM clear Advised RN to restart pit 2x2 Will continue to monitor closely.

## 2022-07-27 NOTE — Anesthesia Preprocedure Evaluation (Addendum)
Anesthesia Evaluation  Patient identified by MRN, date of birth, ID band Patient awake    Reviewed: Allergy & Precautions, Patient's Chart, lab work & pertinent test results  Airway Mallampati: II       Dental no notable dental hx.    Pulmonary asthma    Pulmonary exam normal        Cardiovascular hypertension, Pt. on medications and Pt. on home beta blockers Normal cardiovascular exam     Neuro/Psych    GI/Hepatic   Endo/Other    Renal/GU      Musculoskeletal   Abdominal   Peds  Hematology   Anesthesia Other Findings Pre- E on Mg  Reproductive/Obstetrics (+) Pregnancy                             Anesthesia Physical Anesthesia Plan  ASA: 3  Anesthesia Plan: Epidural   Post-op Pain Management:    Induction:   PONV Risk Score and Plan: 0  Airway Management Planned: Natural Airway  Additional Equipment: None  Intra-op Plan:   Post-operative Plan:   Informed Consent: I have reviewed the patients History and Physical, chart, labs and discussed the procedure including the risks, benefits and alternatives for the proposed anesthesia with the patient or authorized representative who has indicated his/her understanding and acceptance.       Plan Discussed with:   Anesthesia Plan Comments: (Lab Results      Component                Value               Date                      WBC                      8.0                 07/27/2022                HGB                      13.0                07/27/2022                HCT                      37.1                07/27/2022                MCV                      87.7                07/27/2022                PLT                      162                 07/27/2022           )       Anesthesia Quick Evaluation

## 2022-07-27 NOTE — Progress Notes (Signed)
Pt feeling mild contractions.   SVE: 1/30/-3 FHT: 125 mod var +accels no decels TOCO: q3-50min A/P: FB placed Cont. Pit @4   Called by RN approx 1min later pt reporting severe HA and BP severe range.  Advised RN to start IV HTN protocol, orders placed, start MgSO4, 4g bolus f/b 2g maintenance, order placed and 1000mg  tylenol. Will proceed to bedside.
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
## 2022-07-27 NOTE — Anesthesia Procedure Notes (Signed)
Epidural Patient location during procedure: OB Start time: 07/27/2022 5:21 PM End time: 07/27/2022 5:26 PM  Staffing Anesthesiologist: Effie Berkshire, MD Performed: anesthesiologist   Preanesthetic Checklist Completed: patient identified, IV checked, site marked, risks and benefits discussed, surgical consent, monitors and equipment checked, pre-op evaluation and timeout performed  Epidural Patient position: sitting Prep: DuraPrep Patient monitoring: heart rate, continuous pulse ox and blood pressure Approach: midline Location: L3-L4 Injection technique: LOR saline  Needle:  Needle type: Tuohy  Needle gauge: 17 G Needle length: 9 cm Catheter type: closed end flexible Catheter size: 20 Guage Test dose: negative and 1.5% lidocaine  Assessment Events: blood not aspirated, no cerebrospinal fluid, injection not painful, no injection resistance and no paresthesia  Additional Notes LOR @ 4.5  HA prior to epidural, Pre-E on Mg  Patient identified. Risks/Benefits/Options discussed with patient including but not limited to bleeding, infection, nerve damage, paralysis, failed block, incomplete pain control, headache, blood pressure changes, nausea, vomiting, reactions to medications, itching and postpartum back pain. Confirmed with bedside nurse the patient's most recent platelet count. Confirmed with patient that they are not currently taking any anticoagulation, have any bleeding history or any family history of bleeding disorders. Patient expressed understanding and wished to proceed. All questions were answered. Sterile technique was used throughout the entire procedure. Please see nursing notes for vital signs. Test dose was given through epidural catheter and negative prior to continuing to dose epidural or start infusion. Warning signs of high block given to the patient including shortness of breath, tingling/numbness in hands, complete motor block, or any concerning symptoms with  instructions to call for help. Patient was given instructions on fall risk and not to get out of bed. All questions and concerns addressed with instructions to call with any issues or inadequate analgesia.    Reason for block:procedure for pain

## 2022-07-28 ENCOUNTER — Encounter (HOSPITAL_COMMUNITY): Payer: Self-pay | Admitting: Obstetrics and Gynecology

## 2022-07-28 LAB — CBC
HCT: 35 % — ABNORMAL LOW (ref 36.0–46.0)
Hemoglobin: 12.1 g/dL (ref 12.0–15.0)
MCH: 30.7 pg (ref 26.0–34.0)
MCHC: 34.6 g/dL (ref 30.0–36.0)
MCV: 88.8 fL (ref 80.0–100.0)
Platelets: 135 10*3/uL — ABNORMAL LOW (ref 150–400)
RBC: 3.94 MIL/uL (ref 3.87–5.11)
RDW: 13.6 % (ref 11.5–15.5)
WBC: 14.3 10*3/uL — ABNORMAL HIGH (ref 4.0–10.5)
nRBC: 0 % (ref 0.0–0.2)

## 2022-07-28 LAB — RPR: RPR Ser Ql: NONREACTIVE

## 2022-07-28 MED ORDER — OXYCODONE HCL 5 MG PO TABS: 5.0000 mg | ORAL_TABLET | ORAL | Status: DC | PRN

## 2022-07-28 MED ORDER — NIFEDIPINE ER OSMOTIC RELEASE 30 MG PO TB24: 30.0000 mg | ORAL_TABLET | Freq: Every day | ORAL | Status: DC

## 2022-07-28 MED ORDER — ONDANSETRON HCL 4 MG/2ML IJ SOLN: 4.0000 mg | INTRAMUSCULAR | Status: DC | PRN

## 2022-07-28 MED ORDER — LACTATED RINGERS IV SOLN: INTRAVENOUS | Status: DC

## 2022-07-28 MED ORDER — IBUPROFEN 600 MG PO TABS
600.0000 mg | ORAL_TABLET | Freq: Four times a day (QID) | ORAL | Status: DC
  Administered 2022-07-28 – 2022-07-30 (×9): 600 mg via ORAL
  Filled 2022-07-28 (×9): qty 1

## 2022-07-28 MED ORDER — MAGNESIUM SULFATE 40 GM/1000ML IV SOLN
2.0000 g/h | INTRAVENOUS | Status: AC
  Administered 2022-07-28: 2 g/h via INTRAVENOUS
  Filled 2022-07-28: qty 1000

## 2022-07-28 MED ORDER — ONDANSETRON HCL 4 MG PO TABS: 4.0000 mg | ORAL_TABLET | ORAL | Status: DC | PRN

## 2022-07-28 MED ORDER — ACETAMINOPHEN 325 MG PO TABS
650.0000 mg | ORAL_TABLET | ORAL | Status: DC | PRN
  Administered 2022-07-30: 650 mg via ORAL
  Filled 2022-07-28: qty 2

## 2022-07-28 MED ORDER — BENZOCAINE-MENTHOL 20-0.5 % EX AERO
1.0000 | INHALATION_SPRAY | CUTANEOUS | Status: DC | PRN
  Filled 2022-07-28: qty 56

## 2022-07-28 MED ORDER — COCONUT OIL OIL: 1.0000 | TOPICAL_OIL | Status: DC | PRN

## 2022-07-28 MED ORDER — PRENATAL MULTIVITAMIN CH
1.0000 | ORAL_TABLET | Freq: Every day | ORAL | Status: DC
  Administered 2022-07-28 – 2022-07-30 (×3): 1 via ORAL
  Filled 2022-07-28 (×3): qty 1

## 2022-07-28 MED ORDER — OXYCODONE HCL 5 MG PO TABS: 10.0000 mg | ORAL_TABLET | ORAL | Status: DC | PRN

## 2022-07-28 MED ORDER — WITCH HAZEL-GLYCERIN EX PADS: 1.0000 | MEDICATED_PAD | CUTANEOUS | Status: DC | PRN

## 2022-07-28 MED ORDER — TETANUS-DIPHTH-ACELL PERTUSSIS 5-2.5-18.5 LF-MCG/0.5 IM SUSY: 0.5000 mL | PREFILLED_SYRINGE | Freq: Once | INTRAMUSCULAR | Status: DC

## 2022-07-28 MED ORDER — SENNOSIDES-DOCUSATE SODIUM 8.6-50 MG PO TABS
2.0000 | ORAL_TABLET | Freq: Every day | ORAL | Status: DC
  Administered 2022-07-29 – 2022-07-30 (×2): 2 via ORAL
  Filled 2022-07-28 (×3): qty 2

## 2022-07-28 MED ORDER — DIBUCAINE (PERIANAL) 1 % EX OINT: 1.0000 | TOPICAL_OINTMENT | CUTANEOUS | Status: DC | PRN

## 2022-07-28 MED ORDER — SIMETHICONE 80 MG PO CHEW: 80.0000 mg | CHEWABLE_TABLET | ORAL | Status: DC | PRN

## 2022-07-28 MED ORDER — ZOLPIDEM TARTRATE 5 MG PO TABS: 5.0000 mg | ORAL_TABLET | Freq: Every evening | ORAL | Status: DC | PRN

## 2022-07-28 MED ORDER — DIPHENHYDRAMINE HCL 25 MG PO CAPS: 25.0000 mg | ORAL_CAPSULE | Freq: Four times a day (QID) | ORAL | Status: DC | PRN

## 2022-07-28 NOTE — Lactation Note (Addendum)
This note was copied from a baby's chart. Lactation Consultation Note  Patient Name: Amy Villegas [id number] Date: 07/28/2022 Age : 30 hours old , P3  Reason for consult: Initial assessment;Early term 37-38.6wks;Other (Comment) (mom on MagSo4 -) PIH  Baby due to feed, LC offered to assist, mom receptive.  LC checked and changed a large wet diaper.  It's been 4 years since mom breast fed, LC reviewed breast feeding basics, hand expressing with small drops.  Worked on positioning and latching on the right breast / football.  Baby latched over the nipple onto the areola and suckled for 5 mins, and got sleepy.  After breast feeding 5 mins, LC fixed the formula supplement and baby sluggish with the bottle and did not take it.  LC reminded parents the supplementing bottle has 60 mins and then has to be discarded.  Since the baby is sluggish for this feeding LC recommended to try again in 60 mins.  [place] set up with the DEBP, checked the flange #24 F is a good comfortable fit per mom.  Per mom really tired and did not feel up to pumping, wanted to take a nap.     Maternal Data Has patient been taught Hand Expression?: Yes Does the patient have breastfeeding experience prior to this delivery?: Yes How long did the patient breastfeed?: per mom 1st baby 1 month, 2nd 2 - 3 months  Feeding Mother's Current Feeding Choice: Breast Milk and Formula Nipple Type: Extra Slow Flow  LATCH Score Latch: Grasps breast easily, tongue down, lips flanged, rhythmical sucking.  Audible Swallowing: A few with stimulation  Type of Nipple: Everted at rest and after stimulation  Comfort (Breast/Nipple): Soft / non-tender  Hold (Positioning): Assistance needed to correctly position infant at breast and maintain latch.  LATCH Score: 8   Lactation Tools Discussed/Used Tools: Pump;Flanges Breast pump type: Double-Electric Breast Pump Pump Education: Milk Storage;Setup, frequency, and  cleaning  Interventions Interventions: Breast feeding basics reviewed;Assisted with latch;Skin to skin;Breast massage;Hand express;Reverse pressure;Breast compression;Adjust position;Support pillows;Position options;DEBP;Education;LC Services brochure  Discharge Pump: Hands Free;Personal  Consult Status Consult Status: Follow-up Date: 07/29/22 Follow-up type: In-patient    [place]er 07/28/2022, 2:08 PM

## 2022-07-28 NOTE — Anesthesia Postprocedure Evaluation (Signed)
Anesthesia Post Note  Patient: Amy Villegas  Procedure(s) Performed: AN AD HOC LABOR EPIDURAL     Patient location during evaluation: OB High Risk Anesthesia Type: Epidural Level of consciousness: awake and alert Pain management: pain level controlled Vital Signs Assessment: post-procedure vital signs reviewed and stable Respiratory status: spontaneous breathing, nonlabored ventilation and respiratory function stable Cardiovascular status: stable Postop Assessment: no headache, no backache and epidural receding Anesthetic complications: no   No notable events documented.  Last Vitals:  Vitals:   07/28/22 1132 07/28/22 1510  BP: 115/66 108/64  Pulse: 63 64  Resp: 18 18  Temp: 36.7 C 36.7 C  SpO2: 98% 95%    Last Pain:  Vitals:   07/28/22 1510  TempSrc: Oral  PainSc:    Pain Goal: Patients Stated Pain Goal: 2 (07/28/22 0830)                 Gilmer Mor

## 2022-07-28 NOTE — Progress Notes (Signed)
Post Partum Day 0 Subjective: Kambre is feeling tired but otherwise has no complaints. Headache completely resolved. Pain controlled. Ambulating, voiding, tolerating PO. Minimal lochia. No chest pain, shortness of breath, vision changes, RUQ pain.   Objective: Patient Vitals for the past 24 hrs:  BP Temp Temp src Pulse Resp SpO2 Height Weight  07/28/22 0949 109/61 97.7 F (36.5 C) Tympanic 67 18 98 % -- --  07/28/22 0645 115/64 98 F (36.7 C) Oral 65 18 99 % -- --  07/28/22 0615 128/65 97.6 F (36.4 C) Oral 62 17 98 % -- --  07/28/22 0546 117/66 -- -- 64 -- -- -- --  07/28/22 0531 112/64 -- -- 68 -- -- -- --  07/28/22 0516 (!) 118/58 -- -- 65 -- -- -- --  07/28/22 0500 116/64 -- -- 64 16 -- -- --  07/28/22 0446 113/64 -- -- 66 -- -- -- --  07/28/22 0433 (!) 110/55 -- -- 68 -- -- -- --  07/28/22 0416 116/63 -- -- 72 -- -- -- --  07/28/22 0401 116/66 97.6 F (36.4 C) Oral 71 16 -- -- --  07/28/22 0346 (!) 113/54 -- -- 80 -- -- -- --  07/28/22 0344 125/77 -- -- 81 -- -- -- --  07/28/22 0301 120/69 -- -- 82 -- -- -- --  07/28/22 0201 (!) 100/54 -- -- 73 -- -- -- --  07/28/22 0101 111/79 -- -- (!) 266 -- -- -- --  07/28/22 0001 114/60 (!) 97.5 F (36.4 C) Oral 72 16 -- -- --  07/27/22 2346 (!) 97/48 -- -- (!) 59 -- -- -- --  07/27/22 2331 (!) 100/58 -- -- 65 -- -- -- --  07/27/22 2330 -- -- -- -- 16 -- -- --  07/27/22 2320 (!) 98/52 -- -- 65 -- -- -- --  07/27/22 2319 (!) 97/47 -- -- (!) 59 -- -- -- --  07/27/22 2301 (!) 91/44 -- -- 62 -- -- -- --  07/27/22 2230 -- (!) 94.5 F (34.7 C) Oral -- 16 -- -- --  07/27/22 2203 (!) 93/43 -- -- (!) 58 -- -- -- --  07/27/22 2131 112/63 (!) 94.1 F (34.5 C) Oral 62 16 -- -- --  07/27/22 2101 110/62 -- -- (!) 58 -- -- -- --  07/27/22 2031 (!) 107/58 -- -- 62 -- -- -- --  07/27/22 2030 (!) 107/58 -- -- 62 16 -- -- --  07/27/22 2027 -- (!) 94.2 F (34.6 C) Rectal -- -- -- -- --  07/27/22 2021 -- (!) 94.5 F (34.7 C) Oral -- -- -- -- --   07/27/22 2018 -- (!) 94.6 F (34.8 C) Axillary -- -- -- -- --  07/27/22 2001 (!) 92/49 -- -- (!) 55 -- -- -- --  07/27/22 1930 (!) 97/54 98 F (36.7 C) Oral (!) 56 16 -- -- --  07/27/22 1906 (!) 99/57 -- -- (!) 57 -- -- -- --  07/27/22 1901 (!) 96/46 -- -- (!) 57 -- -- -- --  07/27/22 1900 -- 97.7 F (36.5 C) Oral -- 18 -- -- --  07/27/22 1858 (!) 92/49 -- -- (!) 58 -- -- -- --  07/27/22 1830 105/61 -- -- 71 -- -- -- --  07/27/22 1805 98/66 -- -- 83 -- -- -- --  07/27/22 1800 114/72 -- -- 80 16 97 % -- --  07/27/22 1755 111/68 -- -- 81 -- 97 % -- --  07/27/22 1750 (!) 113/58 -- -- 70 --  98 % -- --  07/27/22 1745 118/72 -- -- 75 -- 97 % -- --  07/27/22 1740 117/69 -- -- 73 -- 95 % -- --  07/27/22 1735 123/76 -- -- 74 -- 96 % -- --  07/27/22 1730 133/81 -- -- 65 -- 97 % -- --  07/27/22 1725 131/79 -- -- 69 -- 97 % -- --  07/27/22 1710 -- -- -- -- -- 97 % -- --  07/27/22 1707 (!) 145/94 -- -- 61 -- 100 % -- --  07/27/22 1700 (!) 153/96 99 F (37.2 C) Oral 62 16 100 % -- --  07/27/22 1655 (!) 166/91 -- -- 62 -- 100 % -- --  07/27/22 1641 (!) 192/109 -- -- 63 -- -- -- --  07/27/22 1640 -- 98.9 F (37.2 C) Oral -- 20 100 % -- --  07/27/22 1636 (!) 190/100 -- -- -- -- 99 % -- --  07/27/22 1604 (!) 177/96 98.5 F (36.9 C) Oral (!) 58 -- -- -- --  07/27/22 1500 (!) 144/87 -- -- 70 -- -- -- --  07/27/22 1430 (!) 142/79 -- -- 67 -- -- -- --  07/27/22 1332 132/87 -- -- 66 -- -- -- --  07/27/22 1300 134/84 -- -- 73 -- -- -- --  07/27/22 1203 134/84 -- -- (!) 59 -- -- -- --  07/27/22 1130 129/82 -- -- 61 -- -- -- --  07/27/22 1047 138/88 98.2 F (36.8 C) Oral 63 17 -- 5\' 2"  (1.575 m) 92.9 kg    Physical Exam:  General: alert, cooperative, and no distress Lochia: appropriate Uterine Fundus: firm DVT Evaluation: No evidence of DVT seen on physical exam.  Recent Labs    07/27/22 1050 07/28/22 0549  WBC 8.0 14.3*  HGB 13.0 12.1  HCT 37.1 35.0*  PLT 162 135*    Recent Labs     07/27/22 1050  NA 136  K 3.8  CL 105  BUN 9  CREATININE 0.69  GLUCOSE 71  BILITOT 0.2*  ALT 13  AST 22  ALKPHOS 166*  PROT 6.0*  ALBUMIN 2.7*    Recent Labs    07/27/22 1050  CALCIUM 9.3    No results for input(s): "PROTIME", "APTT", "INR" in the last 72 hours.  No results for input(s): "PROTIME", "APTT", "INR", "FIBRINOGEN" in the last 72 hours. Assessment/Plan:   Amyrie Diaz-Velazquez 32 y.o. G3P3003 PPD#0 sp VBAC, complicated by preeclampsia with severe features 1. Preeclampsia with severe features: by headache and severe range blood pressures, labs normal. On magnesium x 24 hours PP (discontinue at 0325 tomorrow morning). Had Procardia XL 30mg  ordered for this evening but may hold given low/normal blood pressures today. Will monitor. Some borderline hypothermia yesterday that is now resolved (possibly magnesium effect).  2.  RH pos    LOS: 1 day   Rowland Lathe 07/28/2022, 10:11 AM

## 2022-07-29 MED ORDER — NIFEDIPINE ER OSMOTIC RELEASE 30 MG PO TB24
30.0000 mg | ORAL_TABLET | Freq: Every day | ORAL | Status: DC
  Administered 2022-07-29: 30 mg via ORAL
  Filled 2022-07-29: qty 1

## 2022-07-29 NOTE — Progress Notes (Signed)
Last BP @ 1919 149/68, will restart PO Nifedipine 30XL at this time

## 2022-07-29 NOTE — Progress Notes (Signed)
Post Partum Day 1 Subjective: Doing well this AM. HA resolved from yesterday, just tired from lack of sleep. Pain controlled. Ambulating, voiding, tolerating PO. Minimal lochia. No chest pain, shortness of breath, vision changes, RUQ pain.   Objective: Patient Vitals for the past 24 hrs:  BP Temp Temp src Pulse Resp SpO2  07/28/22 2319 (!) 107/55 97.9 F (36.6 C) Oral 77 18 99 %  07/28/22 1948 116/64 97.9 F (36.6 C) Oral 64 18 99 %  07/28/22 1510 108/64 98.1 F (36.7 C) Oral 64 18 95 %  07/28/22 1132 115/66 98 F (36.7 C) Oral 63 18 98 %  07/28/22 0949 109/61 97.7 F (36.5 C) Tympanic 67 18 98 %     Physical Exam:  General: alert, cooperative, and no distress Lochia: appropriate Uterine Fundus: firm DVT Evaluation: No evidence of DVT seen on physical exam.  Recent Labs    07/27/22 1050 07/28/22 0549  WBC 8.0 14.3*  HGB 13.0 12.1  HCT 37.1 35.0*  PLT 162 135*     Recent Labs    07/27/22 1050  NA 136  K 3.8  CL 105  BUN 9  CREATININE 0.69  GLUCOSE 71  BILITOT 0.2*  ALT 13  AST 22  ALKPHOS 166*  PROT 6.0*  ALBUMIN 2.7*     Recent Labs    07/27/22 1050  CALCIUM 9.3     Assessment/Plan:   Amy Villegas 32 y.o. G3P3003 PPD#1 sp VBAC, complicated by preeclampsia with severe features 1. Preeclampsia with severe features: by headache and severe range blood pressures, labs normal. S/p MgSO4 x24hr pp. Had Procardia XL 82m ordered but held 2/2 low normal BP. Trend today to see if meds needed 2.  RH pos    LOS: 2 days   SDeliah Boston2/02/2023, 8:28 AM

## 2022-07-30 MED ORDER — IBUPROFEN 600 MG PO TABS: 600.0000 mg | ORAL_TABLET | Freq: Four times a day (QID) | ORAL | 0 refills | Status: AC

## 2022-07-30 MED ORDER — NIFEDIPINE ER 60 MG PO TB24: 60.0000 mg | ORAL_TABLET | Freq: Every day | ORAL | 1 refills | Status: AC

## 2022-07-30 MED ORDER — NIFEDIPINE ER OSMOTIC RELEASE 60 MG PO TB24
60.0000 mg | ORAL_TABLET | Freq: Every day | ORAL | Status: DC
  Administered 2022-07-30: 60 mg via ORAL
  Filled 2022-07-30: qty 1

## 2022-07-30 NOTE — Progress Notes (Signed)
PPD #2 VBAC, preeclampsia Feels ok, mild headache Afeb, VSS, BP 140-150/70-80 Fundus firm Increased procardia XL to 60 mg this am, will see how BP responds today, possibly d/c home this pm

## 2022-07-30 NOTE — Lactation Note (Signed)
This note was copied from a baby's chart. Lactation Consultation Note  Patient Name: Amy Villegas [id number] Date: 07/30/2022 Reason for consult: Follow-up assessment;Early term 37-38.6wks Age:30 hours  Follow up visit with birthing parent. Birthing parent expressed that baby falls asleep after a few minutes at breast and supplement with formula. Educated mom on feeding cues, milk storage, engorgement prevention, hand expression, baby temperature regulation, and out patient resources. Observed birthing parent's breast and sized flanges (43m). Provided birthing parent with coconut oil for discomfort. Encouraged birthing parent to hydrate, eat and sleep.   Feeding Mother's Current Feeding Choice: Breast Milk and Formula  LATCH Score Did not observe a latch.   Lactation Tools Discussed/Used Flange Size: 30 Breast pump type: Double-Electric Breast Pump Pump Education: Milk Storage Reason for Pumping: to size flanges Pumping frequency: every 3 hours Pumped volume: 3 mL  Interventions Interventions: Coconut oil;Hand express;Breast massage  Discharge Discharge Education: Engorgement and breast care;Warning signs for feeding baby Pump: Personal;DEBP WIC Program: Yes  Consult Status Consult Status: Follow-up Date: 07/31/22 Follow-up type: In-patient    Amy Villegas 07/30/2022, 2:03 PM

## 2022-07-30 NOTE — Plan of Care (Signed)
Problem: Education: Goal: Knowledge of General Education information will improve Description: Including pain rating scale, medication(s)/side effects and non-pharmacologic comfort measures Outcome: Adequate for Discharge   Problem: Health Behavior/Discharge Planning: Goal: Ability to manage health-related needs will improve Outcome: Adequate for Discharge   Problem: Clinical Measurements: Goal: Ability to maintain clinical measurements within normal limits will improve Outcome: Adequate for Discharge Goal: Will remain free from infection Outcome: Adequate for Discharge Goal: Diagnostic test results will improve Outcome: Adequate for Discharge Goal: Respiratory complications will improve Outcome: Adequate for Discharge Goal: Cardiovascular complication will be avoided Outcome: Adequate for Discharge   Problem: Activity: Goal: Risk for activity intolerance will decrease Outcome: Adequate for Discharge   Problem: Nutrition: Goal: Adequate nutrition will be maintained Outcome: Adequate for Discharge   Problem: Coping: Goal: Level of anxiety will decrease Outcome: Adequate for Discharge   Problem: Elimination: Goal: Will not experience complications related to bowel motility Outcome: Adequate for Discharge Goal: Will not experience complications related to urinary retention Outcome: Adequate for Discharge   Problem: Pain Managment: Goal: General experience of comfort will improve Outcome: Adequate for Discharge   Problem: Safety: Goal: Ability to remain free from injury will improve Outcome: Adequate for Discharge   Problem: Skin Integrity: Goal: Risk for impaired skin integrity will decrease Outcome: Adequate for Discharge   Problem: Education: Goal: Knowledge of disease or condition will improve Outcome: Adequate for Discharge Goal: Knowledge of the prescribed therapeutic regimen will improve Outcome: Adequate for Discharge Goal: Individualized Educational  Video(s) Outcome: Adequate for Discharge   Problem: Clinical Measurements: Goal: Complications related to the disease process, condition or treatment will be avoided or minimized Outcome: Adequate for Discharge   Problem: Education: Goal: Knowledge of Childbirth will improve Outcome: Adequate for Discharge Goal: Ability to make informed decisions regarding treatment and plan of care will improve Outcome: Adequate for Discharge Goal: Ability to state and carry out methods to decrease the pain will improve Outcome: Adequate for Discharge Goal: Individualized Educational Video(s) Outcome: Adequate for Discharge   Problem: Coping: Goal: Ability to verbalize concerns and feelings about labor and delivery will improve Outcome: Adequate for Discharge   Problem: Life Cycle: Goal: Ability to make normal progression through stages of labor will improve Outcome: Adequate for Discharge Goal: Ability to effectively push during vaginal delivery will improve Outcome: Adequate for Discharge   Problem: Role Relationship: Goal: Will demonstrate positive interactions with the child Outcome: Adequate for Discharge   Problem: Safety: Goal: Risk of complications during labor and delivery will decrease Outcome: Adequate for Discharge   Problem: Pain Management: Goal: Relief or control of pain from uterine contractions will improve Outcome: Adequate for Discharge   Problem: Education: Goal: Knowledge of disease or condition will improve Outcome: Adequate for Discharge Goal: Knowledge of the prescribed therapeutic regimen will improve Outcome: Adequate for Discharge   Problem: Fluid Volume: Goal: Peripheral tissue perfusion will improve Outcome: Adequate for Discharge   Problem: Clinical Measurements: Goal: Complications related to disease process, condition or treatment will be avoided or minimized Outcome: Adequate for Discharge   Problem: Education: Goal: Knowledge of condition will  improve Outcome: Adequate for Discharge Goal: Individualized Educational Video(s) Outcome: Adequate for Discharge Goal: Individualized Newborn Educational Video(s) Outcome: Adequate for Discharge   Problem: Activity: Goal: Will verbalize the importance of balancing activity with adequate rest periods Outcome: Adequate for Discharge Goal: Ability to tolerate increased activity will improve Outcome: Adequate for Discharge   Problem: Coping: Goal: Ability to identify and utilize available resources and  services will improve Outcome: Adequate for Discharge   Problem: Life Cycle: Goal: Chance of risk for complications during the postpartum period will decrease Outcome: Adequate for Discharge   Problem: Role Relationship: Goal: Ability to demonstrate positive interaction with newborn will improve Outcome: Adequate for Discharge   Problem: Skin Integrity: Goal: Demonstration of wound healing without infection will improve Outcome: Adequate for Discharge

## 2022-07-30 NOTE — Discharge Instructions (Signed)
As per discharge pamphlet °

## 2022-07-31 NOTE — Discharge Summary (Signed)
Postpartum Discharge Summary      Patient Name: Amy Villegas DOB: 11-18-1990 MRN: [id number]  Date of admission: 07/27/2022 Delivery date:07/28/2022  Delivering provider: Allyn Kenner  Date of discharge: 07/30/2022  Admitting diagnosis: Gestational hypertension [O13.9] Intrauterine pregnancy: [redacted]w[redacted]d    Secondary diagnosis:  Principal Problem:   Gestational hypertension    Discharge diagnosis: Term Pregnancy Delivered and Preeclampsia (severe)                                               Hospital course: Induction of Labor With Vaginal Delivery   32y.o. yo G3P3003 at 355w3das admitted to the hospital 07/27/2022 for induction of labor.  Indication for induction: Preeclampsia.  Patient had an labor course complicated by HA and severe range BP, so started on IV magnesium.  Membrane Rupture Time/Date: 6:54 PM ,07/27/2022   Delivery Method:Vaginal, Spontaneous  Episiotomy: None  Lacerations:  None  Details of delivery can be found in separate delivery note.  Patient had a postpartum course complicated by preeclampsia-she received magnesium for 24 hrs, required procardia XL 60 mg daily to control BP. Patient is discharged home 07/31/22.  Newborn Data: Birth date:07/28/2022  Birth time:3:25 AM  Gender:Female  Living status:Living  Apgars:8 ,9  Weight:2990 g   Magnesium Sulfate received: Yes: Seizure prophylaxis  Physical exam  Vitals:   07/30/22 0821 07/30/22 0854 07/30/22 1147 07/30/22 1601  BP: (!) 150/80 (!) 146/79 (!) 143/80 (!) 143/76  Pulse: 84 79 87 84  Resp: 16 17 16 16  $ Temp: 98.2 F (36.8 C) 98.2 F (36.8 C) 98.2 F (36.8 C) 98.2 F (36.8 C)  TempSrc: Oral Oral Oral Oral  SpO2: 99% 98% 100% 99%  Weight:      Height:       General: alert Lochia: appropriate Uterine Fundus: firm  Labs: Lab Results  Component Value Date   WBC 14.3 (H) 07/28/2022   HGB 12.1 07/28/2022   HCT 35.0 (L) 07/28/2022   MCV 88.8 07/28/2022   PLT 135 (L) 07/28/2022       Latest Ref Rng & Units 07/27/2022   10:50 AM  CMP  Glucose 70 - 99 mg/dL 71   BUN 6 - 20 mg/dL 9   Creatinine 0.44 - 1.00 mg/dL 0.69   Sodium 135 - 145 mmol/L 136   Potassium 3.5 - 5.1 mmol/L 3.8   Chloride 98 - 111 mmol/L 105   CO2 22 - 32 mmol/L 20   Calcium 8.9 - 10.3 mg/dL 9.3   Total Protein 6.5 - 8.1 g/dL 6.0   Total Bilirubin 0.3 - 1.2 mg/dL 0.2   Alkaline Phos 38 - 126 U/L 166   AST 15 - 41 U/L 22   ALT 0 - 44 U/L 13    Edinburgh Score:    07/28/2022    5:51 PM  Edinburgh Postnatal Depression Scale Screening Tool  I have been able to laugh and see the funny side of things. 0  I have looked forward with enjoyment to things. 0  I have blamed myself unnecessarily when things went wrong. 0  I have been anxious or worried for no good reason. 0  I have felt scared or panicky for no good reason. 0  Things have been getting on top of me. 1  I have been so unhappy that I have had difficulty  sleeping. 0  I have felt sad or miserable. 0  I have been so unhappy that I have been crying. 0  The thought of harming myself has occurred to me. 0  Edinburgh Postnatal Depression Scale Total 1      After visit meds:  Allergies as of 07/30/2022   No Known Allergies      Medication List     TAKE these medications    ibuprofen 600 MG tablet Commonly known as: ADVIL Take 1 tablet (600 mg total) by mouth every 6 (six) hours.   NIFEdipine 60 MG 24 hr tablet Commonly known as: ADALAT CC Take 1 tablet (60 mg total) by mouth daily.   ondansetron 4 MG disintegrating tablet Commonly known as: Zofran ODT Take 1 tablet (4 mg total) by mouth every 8 (eight) hours as needed for nausea or vomiting.   prenatal multivitamin Tabs tablet Take 1 tablet by mouth daily at 12 noon.         Discharge home in stable condition Infant Feeding: Breast Infant Disposition:home with mother Discharge instruction: per After Visit Summary and Postpartum booklet. Activity: Advance as  tolerated. Pelvic rest for 6 weeks.  Diet: routine diet Postpartum Appointment:2-3 days Follow up Visit:  Follow-up Information     Simmie Garin, Sherren Mocha, MD. Schedule an appointment as soon as possible for a visit.   Specialty: Obstetrics and Gynecology Why: for BP check Contact information: [address] [place] [address] [phone]                     07/31/2022 Clarene Duke, MD

## 2022-08-09 ENCOUNTER — Telehealth (HOSPITAL_COMMUNITY): Payer: Self-pay | Admitting: *Deleted

## 2022-08-09 NOTE — Telephone Encounter (Signed)
Hospital Discharge Follow-Up Call:  Patient reports that she is well.  She is taking medication for high blood pressure and seeing her OB weekly for BP checks.  Next visit is tomorrow.  EPDS today was 1 and she endorses this accurately reflects that she is doing well emotionally.  Patient says that baby is well and she has no concerns about baby's health.  They have a routine follow-up appointment at the pediatrician's office on Feb 22.  She reports that baby sleeps in a bassinet.  Reviewed ABCs of Safe Sleep.
